# Patient Record
Sex: Male | Born: 1980
Health system: Southern US, Community
[De-identification: ages and names within clinical notes are randomized; demographics above are authoritative.]

## PROBLEM LIST (undated history)

## (undated) ENCOUNTER — Emergency Department (HOSPITAL_COMMUNITY): Payer: BC Managed Care – PPO

## (undated) DIAGNOSIS — T7840XA Allergy, unspecified, initial encounter: Secondary | ICD-10-CM

## (undated) HISTORY — DX: Allergy, unspecified, initial encounter: T78.40XA

## (undated) HISTORY — PX: VASECTOMY: SHX75

## (undated) HISTORY — PX: WISDOM TOOTH EXTRACTION: SHX21

---

## 2009-02-21 ENCOUNTER — Emergency Department (HOSPITAL_COMMUNITY): Admission: EM | Admit: 2009-02-21 | Discharge: 2009-02-21 | Payer: Self-pay | Admitting: Emergency Medicine

## 2014-02-04 ENCOUNTER — Ambulatory Visit: Payer: BC Managed Care – PPO | Admitting: Family Medicine

## 2014-02-04 VITALS — BP 152/90 | HR 92 | Temp 98.1°F | Resp 16 | Ht 76.0 in | Wt 223.2 lb

## 2014-02-04 DIAGNOSIS — N453 Epididymo-orchitis: Secondary | ICD-10-CM

## 2014-02-04 DIAGNOSIS — N451 Epididymitis: Secondary | ICD-10-CM

## 2014-02-04 MED ORDER — DOXYCYCLINE HYCLATE 100 MG PO TABS: 100.0000 mg | ORAL_TABLET | Freq: Two times a day (BID) | ORAL | Status: DC

## 2014-02-04 NOTE — Patient Instructions (Signed)
Epididymitis  Epididymitis is a swelling (inflammation) of the epididymis. The epididymis is a cord-like structure along the back part of the testicle. Epididymitis is usually, but not always, caused by infection. This is usually a sudden problem beginning with chills, fever and pain behind the scrotum and in the testicle. There may be swelling and redness of the testicle.  DIAGNOSIS   Physical examination will reveal a tender, swollen epididymis. Sometimes, cultures are obtained from the urine or from prostate secretions to help find out if there is an infection or if the cause is a different problem. Sometimes, blood work is performed to see if your white blood cell count is elevated and if a germ (bacterial) or viral infection is present. Using this knowledge, an appropriate medicine which kills germs (antibiotic) can be chosen by your caregiver. A viral infection causing epididymitis will most often go away (resolve) without treatment.  HOME CARE INSTRUCTIONS   · Hot sitz baths for 20 minutes, 4 times per day, may help relieve pain.  · Only take over-the-counter or prescription medicines for pain, discomfort or fever as directed by your caregiver.  · Take all medicines, including antibiotics, as directed. Take the antibiotics for the full prescribed length of time even if you are feeling better.  · It is very important to keep all follow-up appointments.  SEEK IMMEDIATE MEDICAL CARE IF:   · You have a fever.  · You have pain not relieved with medicines.  · You have any worsening of your problems.  · Your pain seems to come and go.  · You develop pain, redness, and swelling in the scrotum and surrounding areas.  MAKE SURE YOU:   · Understand these instructions.  · Will watch your condition.  · Will get help right away if you are not doing well or get worse.  Document Released: 08/31/2000 Document Revised: 11/26/2011 Document Reviewed: 07/21/2009  ExitCare® Patient Information ©2014 [employer].

## 2014-02-04 NOTE — Progress Notes (Addendum)
° °  Subjective:    Patient ID: Andrew Wiley ReasonMatthew Kilty, male    DOB: 04/26/1981, 33 y.o.   MRN: [id number] This chart was scribed for Andrew SidleKurt Lauenstein, MD by Valera CastleSteven Perry, ED Scribe. This patient was seen in room 01 and the patient's care was started at 6:26 PM.  Chief Complaint  Patient presents with   Testicle Pain    patient was playing softball but is unsure if that is associated x 2 weeks   Groin Pain   HPI Andrew Wiley ReasonMatthew Adamec is a 33 y.o. male Pt presents with aching, left testicle pain, onset 2 weeks ago. He denies swelling. He reports walking down steps at home a few months ago, slipped and fell on tailbone. He reports pain with sitting down that went on for about 1 month. He reports playing softball not long afterwards, went to dive, felt some testicle pain. He denies being able to pinpoint specific time of injury. He denies fever, dysuria, and any other associated symptoms.   PCP - No PCP Per Patient  There are no active problems to display for this patient.  Prior to Admission medications   Not on File   Review of Systems  Constitutional: Negative for fever.  Genitourinary: Positive for testicular pain (left). Negative for dysuria and scrotal swelling.  Skin: Negative for wound.      Objective:   Physical Exam BP 152/90   Pulse 92   Temp(Src) 98.1 F (36.7 C) (Oral)   Resp 16   Ht 6\' 4"  (1.93 m)   Wt 223 lb 3.2 oz (101.243 kg)   BMI 27.18 kg/m2   SpO2 100%  Nursing note and vitals reviewed. Constitutional: He is oriented to person, place, and time. He appears well-developed and well-nourished. No distress.  HENT:  Head: Normocephalic and atraumatic.  Eyes: EOM are normal.  Neck: Neck supple.  Cardiovascular: Normal rate.   Pulmonary/Chest: Effort normal. No respiratory distress.  Musculoskeletal: Normal range of motion.  Neurological: He is alert and oriented to person, place, and time.  Skin: Skin is warm and dry.  Psychiatric: He has a normal mood and affect. His behavior  is normal.  GU:  Left testicle slightly swollen with prominent epididymis.  No erythema or significant tenderness.  Negative hernia exam, no inguinal adenopathy     Assessment & Plan:  Epididymitis, left - Plan: US Scrotum, doxycycline (VIBRA-TABS) 100 MG tablet We have been trying to get the ultrasound department at home and [facility] to give us a time for the ultrasound. The problems come back in an hour and give is that scheduled. Signed, Andrew SidleKurt Lauenstein, MD

## 2014-02-05 ENCOUNTER — Ambulatory Visit (HOSPITAL_COMMUNITY)
Admission: RE | Admit: 2014-02-05 | Discharge: 2014-02-05 | Disposition: A | Discharge status: Home / Self Care | Payer: BC Managed Care – PPO | Source: Ambulatory Visit | Attending: Family Medicine | Admitting: Family Medicine

## 2014-02-05 ENCOUNTER — Other Ambulatory Visit: Payer: Self-pay | Admitting: Family Medicine

## 2014-02-05 ENCOUNTER — Ambulatory Visit (HOSPITAL_COMMUNITY): Payer: BC Managed Care – PPO

## 2014-02-05 DIAGNOSIS — N451 Epididymitis: Secondary | ICD-10-CM

## 2014-02-05 DIAGNOSIS — I861 Scrotal varices: Secondary | ICD-10-CM | POA: Insufficient documentation

## 2014-03-10 ENCOUNTER — Encounter: Payer: Self-pay | Admitting: Family Medicine

## 2014-03-10 DIAGNOSIS — I861 Scrotal varices: Secondary | ICD-10-CM

## 2015-07-21 LAB — CBC AND DIFFERENTIAL
HEMATOCRIT: 44 (ref 41–53)
Hemoglobin: 14.5 (ref 13.5–17.5)
PLATELETS: 364 (ref 150–399)
WBC: 5.2

## 2015-07-21 LAB — HEPATIC FUNCTION PANEL
ALT: 16 (ref 10–40)
AST: 16 (ref 14–40)
Alkaline Phosphatase: 58 (ref 25–125)
BILIRUBIN, TOTAL: 0.6

## 2015-07-21 LAB — BASIC METABOLIC PANEL
BUN: 19 (ref 4–21)
Creatinine: 0.9 (ref 0.6–1.3)
GLUCOSE: 87
Potassium: 4.7 (ref 3.4–5.3)
SODIUM: 138 (ref 137–147)

## 2015-07-21 LAB — TSH: TSH: 1.32 (ref 0.41–5.90)

## 2015-09-26 LAB — POCT ERYTHROCYTE SEDIMENTATION RATE, NON-AUTOMATED: Sed Rate: 5

## 2016-04-18 DIAGNOSIS — M545 Low back pain: Secondary | ICD-10-CM | POA: Diagnosis not present

## 2016-04-18 DIAGNOSIS — M542 Cervicalgia: Secondary | ICD-10-CM | POA: Diagnosis not present

## 2016-05-29 DIAGNOSIS — M545 Low back pain: Secondary | ICD-10-CM | POA: Diagnosis not present

## 2016-06-01 DIAGNOSIS — M542 Cervicalgia: Secondary | ICD-10-CM | POA: Diagnosis not present

## 2016-06-01 DIAGNOSIS — M545 Low back pain: Secondary | ICD-10-CM | POA: Diagnosis not present

## 2016-06-04 DIAGNOSIS — M542 Cervicalgia: Secondary | ICD-10-CM | POA: Diagnosis not present

## 2016-06-04 DIAGNOSIS — M545 Low back pain: Secondary | ICD-10-CM | POA: Diagnosis not present

## 2016-06-26 DIAGNOSIS — D225 Melanocytic nevi of trunk: Secondary | ICD-10-CM | POA: Diagnosis not present

## 2016-06-26 DIAGNOSIS — L281 Prurigo nodularis: Secondary | ICD-10-CM | POA: Diagnosis not present

## 2016-06-26 DIAGNOSIS — L814 Other melanin hyperpigmentation: Secondary | ICD-10-CM | POA: Diagnosis not present

## 2016-06-26 DIAGNOSIS — D1801 Hemangioma of skin and subcutaneous tissue: Secondary | ICD-10-CM | POA: Diagnosis not present

## 2016-08-16 ENCOUNTER — Ambulatory Visit (INDEPENDENT_AMBULATORY_CARE_PROVIDER_SITE_OTHER): Payer: BLUE CROSS/BLUE SHIELD | Admitting: Physician Assistant

## 2016-08-16 ENCOUNTER — Ambulatory Visit (INDEPENDENT_AMBULATORY_CARE_PROVIDER_SITE_OTHER): Payer: BLUE CROSS/BLUE SHIELD

## 2016-08-16 VITALS — BP 130/72 | HR 105 | Temp 99.1°F | Resp 17 | Ht 77.0 in | Wt 230.0 lb

## 2016-08-16 DIAGNOSIS — R05 Cough: Secondary | ICD-10-CM

## 2016-08-16 DIAGNOSIS — R059 Cough, unspecified: Secondary | ICD-10-CM

## 2016-08-16 LAB — POCT CBC
GRANULOCYTE PERCENT: 84.9 % — AB (ref 37–80)
HEMATOCRIT: 38.8 % — AB (ref 43.5–53.7)
HEMOGLOBIN: 13.6 g/dL — AB (ref 14.1–18.1)
Lymph, poc: 0.9 (ref 0.6–3.4)
MCH: 30.2 pg (ref 27–31.2)
MCHC: 35.1 g/dL (ref 31.8–35.4)
MCV: 86.1 fL (ref 80–97)
MID (cbc): 0.4 (ref 0–0.9)
MPV: 6.7 fL (ref 0–99.8)
POC GRANULOCYTE: 7.5 — AB (ref 2–6.9)
POC LYMPH PERCENT: 10 %L (ref 10–50)
POC MID %: 5.1 %M (ref 0–12)
Platelet Count, POC: 307 10*3/uL (ref 142–424)
RBC: 4.51 M/uL — AB (ref 4.69–6.13)
RDW, POC: 12.3 %
WBC: 8.8 10*3/uL (ref 4.6–10.2)

## 2016-08-16 LAB — POCT INFLUENZA A/B
Influenza A, POC: NEGATIVE
Influenza B, POC: NEGATIVE

## 2016-08-16 MED ORDER — HYDROCOD POLST-CPM POLST ER 10-8 MG/5ML PO SUER: 5.0000 mL | Freq: Two times a day (BID) | ORAL | 0 refills | Status: DC | PRN

## 2016-08-16 MED ORDER — AZITHROMYCIN 250 MG PO TABS: ORAL_TABLET | ORAL | 0 refills | Status: DC

## 2016-08-16 MED ORDER — BENZONATATE 100 MG PO CAPS: 100.0000 mg | ORAL_CAPSULE | Freq: Three times a day (TID) | ORAL | 0 refills | Status: DC | PRN

## 2016-08-16 NOTE — Progress Notes (Signed)
MRN: [id number] DOB: 01/17/1981  Subjective:   Andrew Wiley is a 35 y.o. male presenting for chief complaint of Fatigue (onset 1 week) and Cough (onset 4 days/ productive green) . One week ago, [place] [facility], got home and had no energy out of no where. Had to just lie around in the bed for a few days. Had associated chills and body aches. After about 3 days he started feeling better but then developed cough. Has associated sinus headache, sinus congestion, rhinorrhea and sore throat and night sweats. Has tried tylenol cold and flu and mucinex with mild temporarily relief. Denies fever, wheezing, shortness of breath and chest pain, nausea, vomiting and abdominal pain. Has  had sick contact with baby at home who has croup. Has history of seasonal allergies, no history of asthma. Patient has not had flu shot this season. Denies smoking. Denies any other aggravating or relieving factors, no other questions or concerns.  Molli HazardMatthew has a current medication list which includes the following prescription(s): azithromycin. Also has No Known Allergies.  Molli HazardMatthew  has a past medical history of Allergy. Also  has a past surgical history that includes Vasectomy.   Objective:   Vitals: BP 130/72 (BP Location: Right Arm, Patient Position: Sitting, Cuff Size: Normal)   Pulse (!) 105   Temp 99.1 F (37.3 C) (Oral)   Resp 17   Ht 6\' 5"  (1.956 m)   Wt 230 lb (104.3 kg)   SpO2 97%   BMI 27.27 kg/m   Physical Exam  Constitutional: He is oriented to person, place, and time. He appears well-developed and well-nourished. He appears distressed (mild).  HENT:  Head: Normocephalic and atraumatic.  Right Ear: Tympanic membrane, external ear and ear canal normal.  Left Ear: Tympanic membrane, external ear and ear canal normal.  Nose: Mucosal edema and rhinorrhea present. Right sinus exhibits no maxillary sinus tenderness and no frontal sinus tenderness. Left sinus exhibits no  maxillary sinus tenderness and no frontal sinus tenderness.  Mouth/Throat: Uvula is midline and mucous membranes are normal. Posterior oropharyngeal erythema present.  Eyes: Conjunctivae are normal.  Neck: Normal range of motion.  Cardiovascular: Regular rhythm, normal heart sounds and normal pulses.  Tachycardia present.   Pulmonary/Chest: Effort normal and breath sounds normal.  Lymphadenopathy:       Head (right side): No submental, no submandibular, no tonsillar, no preauricular, no posterior auricular and no occipital adenopathy present.       Head (left side): No submental, no submandibular, no tonsillar, no preauricular, no posterior auricular and no occipital adenopathy present.    He has no cervical adenopathy.       Right: No supraclavicular adenopathy present.       Left: No supraclavicular adenopathy present.  Neurological: He is alert and oriented to person, place, and time.  Skin: Skin is warm and dry.  Psychiatric: He has a normal mood and affect.  Vitals reviewed.   Results for orders placed or performed in visit on 08/16/16 (from the past 24 hour(s))  POCT CBC     Status: Abnormal   Collection Time: 08/16/16  8:59 AM  Result Value Ref Range   WBC 8.8 4.6 - 10.2 K/uL   Lymph, poc 0.9 0.6 - 3.4   POC LYMPH PERCENT 10.0 10 - 50 %L   MID (cbc) 0.4 0 - 0.9   POC MID % 5.1 0 - 12 %M   POC Granulocyte 7.5 (A) 2 - 6.9  Granulocyte percent 84.9 (A) 37 - 80 %G   RBC 4.51 (A) 4.69 - 6.13 M/uL   Hemoglobin 13.6 (A) 14.1 - 18.1 g/dL   HCT, POC 16.138.8 (A) 09.643.5 - 53.7 %   MCV 86.1 80 - 97 fL   MCH, POC 30.2 27 - 31.2 pg   MCHC 35.1 31.8 - 35.4 g/dL   RDW, POC 04.512.3 %   Platelet Count, POC 307 142 - 424 K/uL   MPV 6.7 0 - 99.8 fL  POCT Influenza A/B     Status: None   Collection Time: 08/16/16  9:10 AM  Result Value Ref Range   Influenza A, POC Negative Negative   Influenza B, POC Negative Negative    Dg Chest 2 View  Result Date: 08/16/2016 CLINICAL DATA:  Cough and  tachycardia EXAM: CHEST  2 VIEW COMPARISON:  None. FINDINGS: Lungs are clear. Heart size and pulmonary vascularity are normal. No adenopathy. No bone lesions. IMPRESSION: No edema or consolidation. Electronically Signed   By: Bretta BangWilliam  Woodruff III M.D.   On: 08/16/2016 09:50   Assessment and Plan :  1. Cough -Likely pt had flu and is developing secondary bacterial infection. Will cover for atypical organisms. Pt instructed to follow up if no improvement with treatment. Return sooner if symptoms worsen.  - POCT Influenza A/B - POCT CBC - DG Chest 2 View; Future - azithromycin (ZITHROMAX) 250 MG tablet; Take 2 tabs PO x 1 dose, then 1 tab PO QD x 4 days  Dispense: 6 tablet; Refill: 0 - chlorpheniramine-HYDROcodone (TUSSIONEX PENNKINETIC ER) 10-8 MG/5ML SUER; Take 5 mLs by mouth every 12 (twelve) hours as needed for cough.  Dispense: 100 mL; Refill: 0 - benzonatate (TESSALON) 100 MG capsule; Take 1-2 capsules (100-200 mg total) by mouth 3 (three) times daily as needed for cough.  Dispense: 40 capsule; Refill: 0  Benjiman CoreBrittany Olamide Lahaie, PA-C  [facility] 08/16/2016 9:52 AM

## 2016-08-16 NOTE — Patient Instructions (Addendum)
   Community-Acquired Pneumonia, Adult Introduction Pneumonia is an infection of the lungs. One type of pneumonia can happen while a person is in a hospital. A different type can happen when a person is not in a hospital (community-acquired pneumonia). It is easy for this kind to spread from person to person. It can spread to you if you breathe near an infected person who coughs or sneezes. Some symptoms include:  A dry cough.  A wet (productive) cough.  Fever.  Sweating.  Chest pain. Follow these instructions at home:  Take over-the-counter and prescription medicines only as told by your doctor.  Only take cough medicine if you are losing sleep.  If you were prescribed an antibiotic medicine, take it as told by your doctor. Do not stop taking the antibiotic even if you start to feel better.  Sleep with your head and neck raised (elevated). You can do this by putting a few pillows under your head, or you can sleep in a recliner.  Do not use tobacco products. These include cigarettes, chewing tobacco, and e-cigarettes. If you need help quitting, ask your doctor.  Drink enough water to keep your pee (urine) clear or pale yellow. A shot (vaccine) can help prevent pneumonia. Shots are often suggested for:  People older than 37 years of age.  People older than 37 years of age:  Who are having cancer treatment.  Who have long-term (chronic) lung disease.  Who have problems with their body's defense system (immune system). You may also prevent pneumonia if you take these actions:  Get the flu (influenza) shot every year.  Go to the dentist as often as told.  Wash your hands often. If soap and water are not available, use hand sanitizer. Contact a doctor if:  You have a fever.  You lose sleep because your cough medicine does not help. Get help right away if:  You are short of breath and it gets worse.  You have more chest pain.  Your sickness gets worse. This is very  serious if:  You are an older adult.  Your body's defense system is weak.  You cough up blood. This information is not intended to replace advice given to you by your health care provider. Make sure you discuss any questions you have with your health care provider. Document Released: 02/20/2008 Document Revised: 02/09/2016 Document Reviewed: 12/29/2014  2017 Elsevier    IF you received an x-ray today, you will receive an invoice from [facility]. Please contact [facility] at [phone] with questions or concerns regarding your invoice.   IF you received labwork today, you will receive an invoice from [facility]. Please contact [facility] at [phone] with questions or concerns regarding your invoice.   Our billing staff will not be able to assist you with questions regarding bills from these companies.  You will be contacted with the lab results as soon as they are available. The fastest way to get your results is to activate your My Chart account. Instructions are located on the last page of this paperwork. If you have not heard from us regarding the results in 2 weeks, please contact this office.

## 2016-08-20 ENCOUNTER — Telehealth: Payer: Self-pay | Admitting: Family Medicine

## 2016-08-20 NOTE — Telephone Encounter (Signed)
Routed to [employer]

## 2016-08-20 NOTE — Telephone Encounter (Signed)
Please call pt and let him know that the z pack will be in his system for up to two weeks so he is still getting an antibiotic. Cough can last for up to 4 weeks when you have pneumonia. He should be feeling better though not worse, if he is feeling worse, have him come back in and be reevaluated.

## 2016-08-20 NOTE — Telephone Encounter (Signed)
Pt calling to let GrenadaBrittany know that the cough is still there he just finish Z pak and tesslon pills do work he's just concerned about the cough being so productive

## 2016-08-21 ENCOUNTER — Telehealth: Payer: Self-pay | Admitting: Emergency Medicine

## 2016-08-21 NOTE — Telephone Encounter (Signed)
Pt advised to call clinic if no improvement States he is feeling much better

## 2016-09-27 DIAGNOSIS — J309 Allergic rhinitis, unspecified: Secondary | ICD-10-CM | POA: Diagnosis not present

## 2016-09-27 DIAGNOSIS — Z0001 Encounter for general adult medical examination with abnormal findings: Secondary | ICD-10-CM | POA: Diagnosis not present

## 2016-09-27 DIAGNOSIS — K219 Gastro-esophageal reflux disease without esophagitis: Secondary | ICD-10-CM | POA: Diagnosis not present

## 2016-09-27 DIAGNOSIS — M545 Low back pain: Secondary | ICD-10-CM | POA: Diagnosis not present

## 2017-03-08 ENCOUNTER — Ambulatory Visit (INDEPENDENT_AMBULATORY_CARE_PROVIDER_SITE_OTHER): Payer: BLUE CROSS/BLUE SHIELD | Admitting: Family Medicine

## 2017-03-08 ENCOUNTER — Encounter: Payer: Self-pay | Admitting: Family Medicine

## 2017-03-08 VITALS — BP 138/88 | HR 85 | Temp 98.4°F | Wt 239.6 lb

## 2017-03-08 DIAGNOSIS — Z7689 Persons encountering health services in other specified circumstances: Secondary | ICD-10-CM

## 2017-03-08 DIAGNOSIS — R0982 Postnasal drip: Secondary | ICD-10-CM

## 2017-03-08 DIAGNOSIS — J029 Acute pharyngitis, unspecified: Secondary | ICD-10-CM | POA: Diagnosis not present

## 2017-03-08 DIAGNOSIS — R0789 Other chest pain: Secondary | ICD-10-CM

## 2017-03-08 DIAGNOSIS — M25522 Pain in left elbow: Secondary | ICD-10-CM

## 2017-03-08 DIAGNOSIS — K219 Gastro-esophageal reflux disease without esophagitis: Secondary | ICD-10-CM

## 2017-03-08 NOTE — Patient Instructions (Addendum)
Can take Pepcid or Zantac twice a day, daily or as needed.   Fore sore throat- take your zyrtec daily for 5-7 days, can also take ibuprofen or tylenol  Call for an appointment with Dr. Berline Chough for elbow   Food Choices for Gastroesophageal Reflux Disease, Adult When you have gastroesophageal reflux disease (GERD), the foods you eat and your eating habits are very important. Choosing the right foods can help ease the discomfort of GERD. Consider working with a diet and nutrition specialist (dietitian) to help you make healthy food choices. What general guidelines should I follow? Eating plan  Choose healthy foods low in fat, such as fruits, vegetables, whole grains, low-fat dairy products, and lean meat, fish, and poultry.  Eat frequent, small meals instead of three large meals each day. Eat your meals slowly, in a relaxed setting. Avoid bending over or lying down until 2-3 hours after eating.  Limit high-fat foods such as fatty meats or fried foods.  Limit your intake of oils, butter, and shortening to less than 8 teaspoons each day.  Avoid the following: ? Foods that cause symptoms. These may be different for different people. Keep a food diary to keep track of foods that cause symptoms. ? Alcohol. ? Drinking large amounts of liquid with meals. ? Eating meals during the 2-3 hours before bed.  Cook foods using methods other than frying. This may include baking, grilling, or broiling. Lifestyle   Maintain a healthy weight. Ask your health care provider what weight is healthy for you. If you need to lose weight, work with your health care provider to do so safely.  Exercise for at least 30 minutes on 5 or more days each week, or as told by your health care provider.  Avoid wearing clothes that fit tightly around your waist and chest.  Do not use any products that contain nicotine or tobacco, such as cigarettes and e-cigarettes. If you need help quitting, ask your health care  provider.  Sleep with the head of your bed raised. Use a wedge under the mattress or blocks under the bed frame to raise the head of the bed. What foods are not recommended? The items listed may not be a complete list. Talk with your dietitian about what dietary choices are best for you. Grains Pastries or quick breads with added fat. [place] toast. Vegetables Deep fried vegetables. [place] fries. Any vegetables prepared with added fat. Any vegetables that cause symptoms. For some people this may include tomatoes and tomato products, chili peppers, onions and garlic, and horseradish. Fruits Any fruits prepared with added fat. Any fruits that cause symptoms. For some people this may include citrus fruits, such as oranges, grapefruit, pineapple, and lemons. Meats and other protein foods High-fat meats, such as fatty beef or pork, hot dogs, ribs, ham, sausage, salami and bacon. Fried meat or protein, including fried fish and fried chicken. Nuts and nut butters. Dairy Whole milk and chocolate milk. Sour cream. Cream. Ice cream. Cream cheese. Milk shakes. Beverages Coffee and tea, with or without caffeine. Carbonated beverages. Sodas. Energy drinks. Fruit juice made with acidic fruits (such as orange or grapefruit). Tomato juice. Alcoholic drinks. Fats and oils Butter. Margarine. Shortening. Ghee. Sweets and desserts Chocolate and cocoa. Donuts. Seasoning and other foods Pepper. Peppermint and spearmint. Any condiments, herbs, or seasonings that cause symptoms. For some people, this may include curry, hot sauce, or vinegar-based salad dressings. Summary  When you have gastroesophageal reflux disease (GERD), food and lifestyle choices are very  important to help ease the discomfort of GERD.  Eat frequent, small meals instead of three large meals each day. Eat your meals slowly, in a relaxed setting. Avoid bending over or lying down until 2-3 hours after eating.  Limit high-fat foods such as fatty  meat or fried foods. This information is not intended to replace advice given to you by your health care provider. Make sure you discuss any questions you have with your health care provider. Document Released: 09/03/2005 Document Revised: 09/04/2016 Document Reviewed: 09/04/2016 Elsevier Interactive Patient Education  2017 [employer]

## 2017-03-08 NOTE — Progress Notes (Signed)
   Subjective:    Patient ID: Andrew Wiley R Andrew Wiley, male    DOB: 05/18/1981, 36 y.o.   MRN: [id number]  HPI This is a 36 yo male who presents today to establish care. He is married with 38 yo,38yo, 1814 month old. Works for [employer], [employer]. Plays golf.   Has had sore throat, worse in morning. Right side worse than left. Some pain into neck. Pain on and off for a few weeks. Some post nasal drainage, little nasal drainage. Some headache at back. No sinus pressure, no cough. Has seasonal allergies and takes Zyrtec with good relief. Has not taken recently. Had some stomach pain last week, has occasional acid reflux. Takes omeprazole sporatically.   Has left sided elbow pain, back pain. Played sports through college and has had several injuries. Has pain over sternum off and on, worse with weight lifting. Has had work up for chest pain in the past with negative ekg.   Past Medical History:  Diagnosis Date  . Allergy    Past Surgical History:  Procedure Laterality Date  . VASECTOMY     Family History  Problem Relation Age of Onset  . Diabetes Maternal Grandmother   . Diabetes Maternal Grandfather   . Hyperlipidemia Father   . Hypertension Father       Review of Systems Per HPI    Objective:   Physical Exam  Constitutional: He is oriented to person, place, and time. He appears well-developed and well-nourished. No distress.  HENT:  Head: Normocephalic and atraumatic.  Right Ear: Tympanic membrane, external ear and ear canal normal.  Left Ear: Tympanic membrane, external ear and ear canal normal.  Nose: Nose normal.  Mouth/Throat: Posterior oropharyngeal erythema (mild, post nasal drainage present) present. No oropharyngeal exudate or posterior oropharyngeal edema.  Eyes: Conjunctivae are normal.  Neck: Normal range of motion. Neck supple.  Cardiovascular: Normal rate, regular rhythm and normal heart sounds.   Pulmonary/Chest: Effort normal and breath sounds normal. He exhibits no  tenderness.  Neurological: He is alert and oriented to person, place, and time.  Skin: Skin is warm and dry. He is not diaphoretic.  Psychiatric: He has a normal mood and affect. His behavior is normal. Judgment and thought content normal.  Vitals reviewed.     BP 138/88 (BP Location: Right Arm, Patient Position: Sitting, Cuff Size: Normal)   Pulse 85   Temp 98.4 F (36.9 C) (Oral)   Wt 239 lb 9.6 oz (108.7 kg)   SpO2 98%   BMI 28.41 kg/m      Assessment & Plan:  1. Encounter to establish care - follow up for CPE, will request records and determine overdue health maintenance  2. Sore throat - likely related to #3, advised to resume Zyrtec, can also take OTC analgesics, perform salt water gargles - RTC if no improvement with above measures  3. Post-nasal drainage - see #2  4. Chest wall pain - not currently bothering him, discussed tracking triggers, can apply heat and take OTC analgesics  5. Left elbow pain - he wishes to make an appointment with Dr. Berline Choughigby  6. Gastroesophageal reflux disease, esophagitis presence not specified - discussed use of omeprazole, can try H2 blocker if looking to use something PRN that will work faster than PPI  Andrew Reeeborah Choua Ikner, FNP-BC  [facility] [place]  03/08/2017 8:31 PM

## 2017-03-13 ENCOUNTER — Ambulatory Visit (INDEPENDENT_AMBULATORY_CARE_PROVIDER_SITE_OTHER): Payer: BLUE CROSS/BLUE SHIELD

## 2017-03-13 ENCOUNTER — Encounter: Payer: Self-pay | Admitting: Sports Medicine

## 2017-03-13 ENCOUNTER — Ambulatory Visit (INDEPENDENT_AMBULATORY_CARE_PROVIDER_SITE_OTHER): Payer: BLUE CROSS/BLUE SHIELD | Admitting: Sports Medicine

## 2017-03-13 VITALS — BP 140/82 | HR 73 | Ht 77.0 in | Wt 239.8 lb

## 2017-03-13 DIAGNOSIS — M25522 Pain in left elbow: Secondary | ICD-10-CM

## 2017-03-13 DIAGNOSIS — M7702 Medial epicondylitis, left elbow: Secondary | ICD-10-CM | POA: Diagnosis not present

## 2017-03-13 MED ORDER — NITROGLYCERIN 0.2 MG/HR TD PT24: MEDICATED_PATCH | TRANSDERMAL | 1 refills | Status: DC

## 2017-03-13 NOTE — Progress Notes (Signed)
OFFICE VISIT NOTE Andrew Wiley. [facility] In[facility] at [facility] [phone]  Andrew Wiley - 36 y.o. male MRN [id number]  Date of birth: 1980/12/19  Visit Date: 03/13/2017  PCP: Andrew Belfast, FNP   Referred by: Andrew Belfast, FNP  Andrew Wiley, CMA acting as scribe for Dr. Berline Chough.  SUBJECTIVE:   Chief Complaint  Patient presents with  . left elbow pain   HPI: As below and per problem based documentation when appropriate.  Pt presents today with complaint of pain in the left elbow.  Pain started a couple of years ago. He has been seen by ortho Andrew Wiley) in the past and was told he had a minor strain.  He first noticed the pain when trying a new exercise at the gym. He played sports throughout college, left handed pitcher. Pain is mostly on the ulnar aspect of the elbow.   The pain is described as pulling sensation and is rated as 5/10 when flaring up.  Worsened with lifting. He feels like something in his elbow is going to pop when he is holding his daughter. He also has pain when elbows are on a hard surface. Nothing seems to help alleviate the pain but it is not a constant pain.  Therapies tried include : He has tried using a compression sleeve with no relief. He was given home exercises in the past and got some improvement. He was told there are certain things he shouldn't do but when the pain isn't present to sometimes is noncompliant.   Other associated symptoms include: He experiences some numbness in the left arm when his elbow/arm is positioned a certain way.   Pt denies fever, chills, night sweats.     Review of Systems  Constitutional: Negative for chills and fever.  Respiratory: Negative for shortness of breath and wheezing.   Cardiovascular: Positive for chest pain. Negative for palpitations.  Musculoskeletal: Positive for joint pain. Negative for falls.  Neurological: Positive for dizziness and  tingling. Negative for headaches.  Endo/Heme/Allergies: Does not bruise/bleed easily.    Otherwise per HPI.  HISTORY & PERTINENT PRIOR DATA:  No specialty comments available. He reports that he has never smoked. He has quit using smokeless tobacco. No results for input(s): HGBA1C, LABURIC in the last 8760 hours. Medications & Allergies reviewed per EMR Patient Active Problem List   Diagnosis Date Noted  . Golfer's elbow, left 04/06/2017  . Abscess of left knee 04/05/2017  . Left elbow pain 03/13/2017  . Bilateral varicoceles 03/10/2014   Past Medical History:  Diagnosis Date  . Allergy    Family History  Problem Relation Age of Onset  . Diabetes Maternal Grandmother   . Diabetes Maternal Grandfather   . Hyperlipidemia Father   . Hypertension Father    Past Surgical History:  Procedure Laterality Date  . VASECTOMY    . WISDOM TOOTH EXTRACTION     Social History   Occupational History  . Not on file.   Social History Main Topics  . Smoking status: Never Smoker  . Smokeless tobacco: Former Neurosurgeon  . Alcohol use 2.5 oz/week    5 Standard drinks or equivalent per week  . Drug use: No  . Sexual activity: Not on file    OBJECTIVE:  VS:  HT:6\' 5"  (195.6 cm)   WT:239 lb 12.8 oz (108.8 kg)  BMI:28.5    BP:140/82  HR:73bpm  TEMP: ( )  RESP:98 % EXAM: Findings:  WDWN, NAD, Non-toxic appearing Alert & appropriately interactive Not depressed or anxious appearing No increased work of breathing. Pupils are equal. EOM intact without nystagmus No clubbing or cyanosis of the extremities appreciated No significant rashes/lesions/ulcerations overlying the examined area. Radial pulses 2+/4.  No significant generalized UE edema. Sensation intact to light touch in upper extremities.  Left elbow: Overall well aligned but he does have a prominence of the medial condyle on the left.  He has pain with resisted wrist flexion and with grip strength which is minimal.  Focal TTP over  the origin of the common flexor tendon.  He is ligamentously stable and no pain with milking maneuver.       Dg Elbow 2 Views Left  Result Date: 03/13/2017 CLINICAL DATA:  Left elbow pain, no known injury, initial encounter EXAM: LEFT ELBOW - 2 VIEW COMPARISON:  None. FINDINGS: There is no evidence of fracture, dislocation, or joint effusion. There is no evidence of arthropathy or other focal bone abnormality. Soft tissues are unremarkable. IMPRESSION: No acute abnormality noted. Electronically Signed   By: Alcide CleverMark  Wiley M.D.   On: 03/13/2017 16:27   ASSESSMENT & PLAN:  Andrew Wiley was seen today for left elbow pain. Diagnoses and all orders for this visit:  Left elbow pain -     DG Elbow 2 Views Left; Future  Golfer's elbow, left -     nitroGLYCERIN (NITRODUR - DOSED IN MG/24 HR) 0.2 mg/hr patch; Place 1/4 to 1/2 of a patch over affected region. Remove and replace once daily.  Slightly alter skin placement daily  ================================================================= Golfer's elbow, left Symptoms are consistent with medial epicondylosis.  Nitroglycerin protocol, eccentric exercises and common flexor tendon offloading bracing/compression reviewed.  If any lack of improvement will obtain MSK ultrasound follow-up  ================================================================= Follow-up: Return in about 6 weeks (around 04/24/2017).   CMA/ATC served as Neurosurgeonscribe during this visit. History, Physical, and Plan performed by medical provider. Documentation and orders reviewed and attested to.      Andrew BiddingMichael Jary Louvier, DO    Andrew Wiley [facility] Medicine Physician

## 2017-03-18 ENCOUNTER — Telehealth: Payer: Self-pay | Admitting: Family Medicine

## 2017-03-18 NOTE — Telephone Encounter (Signed)
ROI fax to [employer] [facility] [employer] °

## 2017-03-26 ENCOUNTER — Encounter: Payer: Self-pay | Admitting: Sports Medicine

## 2017-03-26 ENCOUNTER — Ambulatory Visit (INDEPENDENT_AMBULATORY_CARE_PROVIDER_SITE_OTHER): Payer: BLUE CROSS/BLUE SHIELD | Admitting: Sports Medicine

## 2017-03-26 VITALS — BP 128/92 | HR 66 | Ht 77.0 in | Wt 235.4 lb

## 2017-03-26 DIAGNOSIS — G8929 Other chronic pain: Secondary | ICD-10-CM

## 2017-03-26 DIAGNOSIS — M545 Low back pain: Secondary | ICD-10-CM

## 2017-03-26 NOTE — Patient Instructions (Signed)
Also check out the YouTube Video from Dr. Eric Goodman.  I would like to see you try performing this 5-6 days per week.    A good intro video is: "Independence from Pain 7-minute Video" - https://www.youtube.com/watch?[email or web address]   His more advanced video is: "Powerful Posture and Pain Relief: 12 minutes of Foundation Training" - https://youtu.be/4BOTvaRaDjI   Do not try to attempt this entire video when first beginning.    Try breaking of each exercise that he goes into shorter segments.  Otherwise if they perform an exercise for 45 seconds, start with 15 seconds and rest and then resume with a begin the new activity.  Work your way up to doing this 12 minute video and I expect to see significant improvements in your pain.  

## 2017-03-26 NOTE — Progress Notes (Signed)
OFFICE VISIT NOTE Andrew Wiley. Andrew Wiley Sports Medicine [facility] at [facility] [phone]  Andrew Wiley - 36 y.o. male MRN [id number]  Date of birth: 28-Jun-1981  Visit Date: 03/26/2017  PCP: Andrew Belfast, FNP   Referred by: Andrew Belfast, FNP  Andrew Wiley, CMA acting as scribe for Dr. Berline Chough.  SUBJECTIVE:   Chief Complaint  Patient presents with  . pain in back   HPI: As below and per problem based documentation when appropriate.  Pt presents today with complaint of back pain. Most of his pain is in the lower back and neck.  This is a chronic issue x several years. He has seen ortho and PT in the past and gotten no long term relief. He had xray's of c-spine and l-spine done at [facility] about 1 year ago. Pt has hx of injury to the lower back/sacrum. His lower back pain started when he son was born from reaching to pick him up and playing with him. He occasional left sided chest wall pain. He has had cardiac workup in the past. He feels the chest wall pain when he turns his head to the right.   The pain is described as aching and stiffness. Lower back pain is rated as 6/10, more like a pinching sensation. The pain does occasionally radiate into the left leg. Neck pain is rated as about 3/10 and is more of a dull aching pain.   Worsened with sitting for extended periods of time.  Improves with rest Therapies tried include : Naproxen with some relief, rarely as needed.   Other associated symptoms include: chest wall pain, shoulder pain, radiating pain into the left leg. He is unable to do any type of spinning.   Pt denies fever, night sweats. He does have chills on occasion. Sometimes when his neck is hurting he gets chills and fill nauseated.    Review of Systems  Constitutional: Negative for chills and fever.  Respiratory: Negative for shortness of breath and wheezing.   Cardiovascular: Negative for chest pain, palpitations and  leg swelling.  Musculoskeletal: Positive for back pain and neck pain. Negative for falls.  Neurological: Positive for dizziness, tingling (left arm) and headaches.  Endo/Heme/Allergies: Does not bruise/bleed easily.    Otherwise per HPI.  HISTORY & PERTINENT PRIOR DATA:  No specialty comments available. He reports that he has never smoked. He has quit using smokeless tobacco. No results for input(s): HGBA1C, LABURIC in the last 8760 hours. Medications & Allergies reviewed per EMR Patient Active Problem List   Diagnosis Date Noted  . Low back pain 04/27/2017  . Golfer's elbow, left 04/06/2017  . Abscess of left knee 04/05/2017  . Left elbow pain 03/13/2017  . Bilateral varicoceles 03/10/2014   Past Medical History:  Diagnosis Date  . Allergy    Family History  Problem Relation Age of Onset  . Diabetes Maternal Grandmother   . Diabetes Maternal Grandfather   . Hyperlipidemia Father   . Hypertension Father    Past Surgical History:  Procedure Laterality Date  . VASECTOMY    . WISDOM TOOTH EXTRACTION     Social History   Occupational History  . Not on file.   Social History Main Topics  . Smoking status: Never Smoker  . Smokeless tobacco: Former Neurosurgeon  . Alcohol use 2.5 oz/week    5 Standard drinks or equivalent per week  . Drug use: No  . Sexual activity: Not on file  OBJECTIVE:  VS:  HT:6\' 5"  (195.6 cm)   WT:235 lb 6.4 oz (106.8 kg)  BMI:28    BP:(!) 128/92  HR:66bpm  TEMP: ( )  RESP:97 % EXAM: Findings:  WDWN, NAD, Non-toxic appearing Alert & appropriately interactive Not depressed or anxious appearing No increased work of breathing. Pupils are equal. EOM intact without nystagmus No clubbing or cyanosis of the extremities appreciated No significant rashes/lesions/ulcerations overlying the examined area. DP & PT pulses 2+/4.  No significant pretibial edema.  No clubbing or cyanosis Sensation intact to light touch in lower extremities.  Back & Lower  Extremities: Bilateral negative straight leg raise. No significant midline tenderness.   No focal TTP.  Generalized nonfocal soreness in bilateral SI joint regions. Good internal and external rotation of the hips. Patient is able to heel and toe walk without significant difficulty.  Manual muscle testing is 5+/5 in BLE myotomes without focality Lower extremity DTRs 2+/4 diffusely and symmetric      No results found. ASSESSMENT & PLAN:     ICD-10-CM   1. Chronic bilateral low back pain without sciatica M54.5    G89.29   ================================================================= Low back pain Functional low back pain.  Therapeutic exercises reviewed with patient importance of core conditioning exercises reviewed.  Foundations training videos provided.  Follow-up for repeat evaluation and consideration of further diagnostic evaluation if worsening or more focal pain. =================================================================  Follow-up: No Follow-up on file.   CMA/ATC served as Neurosurgeonscribe during this visit. History, Physical, and Plan performed by medical provider. Documentation and orders reviewed and attested to.      Gaspar BiddingMichael Rigby, DO    [facility] Phys[facility]

## 2017-04-05 DIAGNOSIS — L02416 Cutaneous abscess of left lower limb: Secondary | ICD-10-CM | POA: Insufficient documentation

## 2017-04-06 DIAGNOSIS — G5622 Lesion of ulnar nerve, left upper limb: Secondary | ICD-10-CM | POA: Insufficient documentation

## 2017-04-06 NOTE — Assessment & Plan Note (Signed)
Symptoms are consistent with medial epicondylosis.  Nitroglycerin protocol, eccentric exercises and common flexor tendon offloading bracing/compression reviewed.  If any lack of improvement will obtain MSK ultrasound follow-up

## 2017-04-18 DIAGNOSIS — K219 Gastro-esophageal reflux disease without esophagitis: Secondary | ICD-10-CM | POA: Diagnosis not present

## 2017-04-18 DIAGNOSIS — J310 Chronic rhinitis: Secondary | ICD-10-CM | POA: Diagnosis not present

## 2017-04-24 ENCOUNTER — Ambulatory Visit: Payer: BLUE CROSS/BLUE SHIELD | Admitting: Sports Medicine

## 2017-04-26 ENCOUNTER — Ambulatory Visit: Payer: BLUE CROSS/BLUE SHIELD | Admitting: Sports Medicine

## 2017-04-27 DIAGNOSIS — M545 Low back pain, unspecified: Secondary | ICD-10-CM | POA: Insufficient documentation

## 2017-04-27 NOTE — Assessment & Plan Note (Signed)
Functional low back pain.  Therapeutic exercises reviewed with patient importance of core conditioning exercises reviewed.  Foundations training videos provided.  Follow-up for repeat evaluation and consideration of further diagnostic evaluation if worsening or more focal pain.

## 2017-04-29 ENCOUNTER — Ambulatory Visit: Payer: BLUE CROSS/BLUE SHIELD | Admitting: Sports Medicine

## 2017-04-29 DIAGNOSIS — Z0289 Encounter for other administrative examinations: Secondary | ICD-10-CM

## 2017-04-30 ENCOUNTER — Encounter: Payer: Self-pay | Admitting: Sports Medicine

## 2017-04-30 ENCOUNTER — Ambulatory Visit: Payer: Self-pay

## 2017-04-30 ENCOUNTER — Ambulatory Visit (INDEPENDENT_AMBULATORY_CARE_PROVIDER_SITE_OTHER): Payer: BLUE CROSS/BLUE SHIELD | Admitting: Sports Medicine

## 2017-04-30 VITALS — BP 120/80 | HR 73 | Ht 77.0 in | Wt 238.4 lb

## 2017-04-30 DIAGNOSIS — M545 Low back pain, unspecified: Secondary | ICD-10-CM

## 2017-04-30 DIAGNOSIS — M25522 Pain in left elbow: Secondary | ICD-10-CM

## 2017-04-30 DIAGNOSIS — G5622 Lesion of ulnar nerve, left upper limb: Secondary | ICD-10-CM

## 2017-04-30 DIAGNOSIS — G8929 Other chronic pain: Secondary | ICD-10-CM | POA: Diagnosis not present

## 2017-04-30 NOTE — Progress Notes (Signed)
OFFICE VISIT NOTE Veverly FellsMichael D. Delorise Shinerigby, DO  [facility] [phone]  Sharlotte AlamoMatthew R Tromp - 36 y.o. male MRN [id number]  Date of birth: 05/19/1981  Visit Date: 04/30/2017  PCP: Emi BelfastGessner, Deborah B, FNP   Referred by: Emi BelfastGessner, Deborah B, FNP  Orlie DakinBrandy Shelton, CMA acting as scribe for Dr. Berline Choughigby.  SUBJECTIVE:   Chief Complaint  Patient presents with  . Follow-up    chronic bilateral low back pain   HPI: As below and per problem based documentation when appropriate.  Mr. Andrew Wiley is an established patient presenting today in follow-up of chronic bilateral low back pain with sciatica. He was last seen 03/26/2017 and provided with core conditioning and foundation exercises.   Pt reports that he has been doing the exercises provided sometimes. He says that he has felt pretty good since his last visit. Back pain is currently rated about 3/10. The pain comes and goes depending on his positioning. Pain is worse with bending, leaning down or from one side to the other. The left side of the back is worse than the right. He does have occasional radiation of pain into both legs. He has been having pain in his right foot for the past week and a half. The pain is occasionally sharp but just feels like general pain, at times up to 6/10. He says that it feels like he is stepping on a thin board right at the arch of his foot. This pain only occurs when walking on a incline.   He is also following up on his left elbow. He had tried following Nitro Protocol in the past but he wasn't really noticing a difference in the pain and he was having headaches. Pt reports that his elbow is generally about the same. Pain is worse when he put any type of strain on his left arm, like carrying is daughter. He says that he feels like the elbow is swollen. He denies recent pinching, popping, cracking. The pain will occasionally radiate into the forearm. The pain seems to come and go  pretty quickly. He has noticed a lot of tightness in his forearm when he is stretching. Has done some stretching and weight lifting over the past 2 days.     Review of Systems  Constitutional: Negative for chills and fever.  HENT: Positive for congestion.   Eyes: Negative.   Respiratory: Negative for shortness of breath and wheezing.   Cardiovascular: Positive for chest pain (not cardiac related). Negative for palpitations.  Gastrointestinal: Negative.   Genitourinary: Negative.   Musculoskeletal: Positive for myalgias. Negative for falls.  Skin: Negative.   Neurological: Positive for dizziness, tingling and headaches.  Endo/Heme/Allergies: Does not bruise/bleed easily.  Psychiatric/Behavioral: Negative.     Otherwise per HPI.  HISTORY & PERTINENT PRIOR DATA:  No specialty comments available. He reports that he has never smoked. He has quit using smokeless tobacco. No results for input(s): HGBA1C, LABURIC in the last 8760 hours. Medications & Allergies reviewed per EMR Patient Active Problem List   Diagnosis Date Noted  . Low back pain 04/27/2017  . Ulnar neuritis, left 04/06/2017  . Abscess of left knee 04/05/2017  . Left elbow pain 03/13/2017  . Bilateral varicoceles 03/10/2014   Past Medical History:  Diagnosis Date  . Allergy    Family History  Problem Relation Age of Onset  . Diabetes Maternal Grandmother   . Diabetes Maternal Grandfather   . Hyperlipidemia Father   . Hypertension Father  Past Surgical History:  Procedure Laterality Date  . VASECTOMY    . WISDOM TOOTH EXTRACTION     Social History   Occupational History  . Not on file.   Social History Main Topics  . Smoking status: Never Smoker  . Smokeless tobacco: Former Neurosurgeon  . Alcohol use 2.5 oz/week    5 Standard drinks or equivalent per week  . Drug use: No  . Sexual activity: Not on file    OBJECTIVE:  VS:  HT:6\' 5"  (195.6 cm)   WT:238 lb 6.4 oz (108.1 kg)  BMI:28.3    BP:120/80  HR:73bpm   TEMP: ( )  RESP:98 % EXAM: Findings:  WDWN, NAD, Non-toxic appearing Alert & appropriately interactive Not depressed or anxious appearing No increased work of breathing. Pupils are equal. EOM intact without nystagmus No clubbing or cyanosis of the extremities appreciated No significant rashes/lesions/ulcerations overlying the examined area. Radial pulses 2+/4.  No significant generalized UE edema. DP & PT pulses 2+/4.  No significant pretibial edema.  No clubbing or cyanosis Sensation intact to light touch in upper and lower extremities.  Left elbow: Overall well aligned.  No significant bony abnormalities.  He is quite muscular forearm.  He has no focal pain today over the medial epicondyle.  He has a slightly positive Tinel's over the cubital tunnel radiating down into the fourth and fifth fingers but this is minimal.  This does not seem to be the pain that he is experiencing the most.  The lateral aspect pain that he has is hard to delineate but this is over the area of most focal swelling.  He has no pain with bicep hook test no pain with resisted supination but pain with terminal flexion.  There is a small palpable nodule along the distal lateral upper arm but this is nonpainful and reports this being present for quite some time.  Low back: Overall his sit/stand function is improved.  He has good lumbar range of motion.  No lower extremity symptoms and lower extremity strength is symmetric and intact.     [place] Limited Joint Space Structures Up Left(no Linked Charges)  Result Date: 04/30/2017 Andrena Mews, DO     05/01/2017  7:06 AM LIMITED MSK ULTRASOUND OF left elbow Images were obtained and interpreted by myself, Gaspar Bidding, DO Images have been saved and stored to PACS system. Images obtained on: GE S7 Ultrasound machine FINDINGS:  Medial epicondyle has normal-appearing insertion of the flexor tendon bundle.  There is a small amount of increased fibrosis within the flexor tendon  muscle fibers approximately 4-5 cm distal to the insertion consistent with a prior partial tear.  This area is nonpainful form.  The ulnar nerve does sit within the ulnar groove with extension but with flexion there is subluxation with almost complete translocation outside of the ulnar groove with terminal flexion.  Lateral views including of the lateral epicondyles show only minimal hypoechoic change with no focal tendon abnormality that can be appreciated but the biceps tendon and brachioradialis are incompletely visualized due to technical limitations of MSK ultrasound and abundant muscle mass  The small nodule that is appreciated on the anterior lateral upper arm is approximately 1.5 cm in diameter can be appreciated on MSK ultrasound with no increased neovascularity and fairly well-circumscribed nodule within the subcutaneous tissue likely reflective of chronic fat necrosis IMPRESSION: 1. Healed partial tear of the common flexor muscles with normal-appearing common flexor tendon origin. 2. Ulnar nerve subluxation with almost complete transposition  in terminal flexion 3. Subcutaneous nodule in the anterior lateral elbow likely reflective of small area of fat necrosis from likely prior trauma.  Benign-appearing 4. Incompletely visualized elbow flexors   ASSESSMENT & PLAN:     ICD-10-CM   1. Left elbow pain M25.522 [place] LIMITED JOINT SPACE STRUCTURES UP LEFT(NO LINKED CHARGES)    MR ELBOW LEFT WO CONTRAST  2. Chronic bilateral low back pain without sciatica M54.5    G89.29   3. Ulnar neuritis, left G56.22   ================================================================= Ulnar neuritis, left His elbow pain seems to be multifactorial.  Please see the above for further information regarding the lateral pain.  Medial sided pain he is having does not appear to be significant flexor tendinopathy but instead is likely reflective of a subluxing ulnar nerve through the cubital tunnel.  This is appreciated on  MSK ultrasound and if the lateral symptoms with ulnar nerve pain come to symptomatic or further operative intervention is indicated for other elbow issues ulnar transposition could be considered but he is having only minimal ulnar nerve symptoms at this time.   Left elbow pain Multifactorial elbow pain.  Please see the above.  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  The lateral and anterior pain that he is experiencing is clearly delineated.  I am concerned potentially for a partial biceps tear.  He does have a small amount of swelling just at baseline over the anterior arm there may be more of an issue and appreciated on MSK ultrasound.  MRI ordered today for further evaluation.  Of note he does have a small subcutaneous nodule that is likely associated with fat necrosis but there is no concerning features of this but this will be better characterized with the MRI.  Briefly discussed nonoperative options as well including PRP for potential partial tearing  Low back pain His low back pain has improved and is only intermittently a problem for him.  He does have some residual pain but overall is happy with his progress and feels that he can improve his adherence to his exercise program.  He has found benefit with the exercises previously provided. =================================================================   Follow-up: Return for review of MRI.   CMA/ATC served as Neurosurgeon during this visit. History, Physical, and Plan performed by medical provider. Documentation and orders reviewed and attested to.      Gaspar Bidding, DO    [facility] Physician

## 2017-04-30 NOTE — Patient Instructions (Signed)

## 2017-05-01 NOTE — Assessment & Plan Note (Signed)
His elbow pain seems to be multifactorial.  Please see the above for further information regarding the lateral pain.  Medial sided pain he is having does not appear to be significant flexor tendinopathy but instead is likely reflective of a subluxing ulnar nerve through the cubital tunnel.  This is appreciated on MSK ultrasound and if the lateral symptoms with ulnar nerve pain come to symptomatic or further operative intervention is indicated for other elbow issues ulnar transposition could be considered but he is having only minimal ulnar nerve symptoms at this time.

## 2017-05-01 NOTE — Assessment & Plan Note (Signed)
His low back pain has improved and is only intermittently a problem for him.  He does have some residual pain but overall is happy with his progress and feels that he can improve his adherence to his exercise program.  He has found benefit with the exercises previously provided.

## 2017-05-01 NOTE — Assessment & Plan Note (Signed)
Multifactorial elbow pain.  Please see the above.  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  The lateral and anterior pain that he is experiencing is clearly delineated.  I am concerned potentially for a partial biceps tear.  He does have a small amount of swelling just at baseline over the anterior arm there may be more of an issue and appreciated on MSK ultrasound.  MRI ordered today for further evaluation.  Of note he does have a small subcutaneous nodule that is likely associated with fat necrosis but there is no concerning features of this but this will be better characterized with the MRI.  Briefly discussed nonoperative options as well including PRP for potential partial tearing

## 2017-05-01 NOTE — Procedures (Signed)
LIMITED MSK ULTRASOUND OF left elbow Images were obtained and interpreted by myself, Gaspar BiddingMichael Edger Husain, DO  Images have been saved and stored to PACS system. Images obtained on: GE S7 Ultrasound machine  FINDINGS:   Medial epicondyle has normal-appearing insertion of the flexor tendon bundle.  There is a small amount of increased fibrosis within the flexor tendon muscle fibers approximately 4-5 cm distal to the insertion consistent with a prior partial tear.  This area is nonpainful form.  The ulnar nerve does sit within the ulnar groove with extension but with flexion there is subluxation with almost complete translocation outside of the ulnar groove with terminal flexion.  Lateral views including of the lateral epicondyles show only minimal hypoechoic change with no focal tendon abnormality that can be appreciated but the biceps tendon and brachioradialis are incompletely visualized due to technical limitations of MSK ultrasound and abundant muscle mass  The small nodule that is appreciated on the anterior lateral upper arm is approximately 1.5 cm in diameter can be appreciated on MSK ultrasound with no increased neovascularity and fairly well-circumscribed nodule within the subcutaneous tissue likely reflective of chronic fat necrosis  IMPRESSION:  1. Healed partial tear of the common flexor muscles with normal-appearing common flexor tendon origin. 2. Ulnar nerve subluxation with almost complete transposition in terminal flexion 3. Subcutaneous nodule in the anterior lateral elbow likely reflective of small area of fat necrosis from likely prior trauma.  Benign-appearing 4. Incompletely visualized elbow flexors

## 2017-05-17 ENCOUNTER — Other Ambulatory Visit: Payer: BLUE CROSS/BLUE SHIELD

## 2017-05-21 ENCOUNTER — Ambulatory Visit
Admission: RE | Admit: 2017-05-21 | Discharge: 2017-05-21 | Disposition: A | Discharge status: Home / Self Care | Payer: BLUE CROSS/BLUE SHIELD | Source: Ambulatory Visit | Attending: Sports Medicine | Admitting: Sports Medicine

## 2017-05-21 DIAGNOSIS — M25522 Pain in left elbow: Secondary | ICD-10-CM

## 2017-05-28 ENCOUNTER — Telehealth: Payer: Self-pay | Admitting: Sports Medicine

## 2017-05-28 NOTE — Telephone Encounter (Signed)
Patient needs a call back RE referral to Ortho Surgery.  Ty,  -LL

## 2017-05-29 ENCOUNTER — Other Ambulatory Visit: Payer: Self-pay

## 2017-05-29 ENCOUNTER — Ambulatory Visit: Payer: BLUE CROSS/BLUE SHIELD | Admitting: Sports Medicine

## 2017-05-29 DIAGNOSIS — G5622 Lesion of ulnar nerve, left upper limb: Secondary | ICD-10-CM

## 2017-05-29 DIAGNOSIS — M25522 Pain in left elbow: Secondary | ICD-10-CM

## 2017-05-29 NOTE — Telephone Encounter (Signed)
Spoke with patient and he has decided that he would like to be referred to Dr. August Saucerean to discuss surgical options for his elbow. Referral has been placed.

## 2017-06-13 ENCOUNTER — Ambulatory Visit (INDEPENDENT_AMBULATORY_CARE_PROVIDER_SITE_OTHER): Payer: BLUE CROSS/BLUE SHIELD | Admitting: Orthopedic Surgery

## 2017-06-13 ENCOUNTER — Encounter (INDEPENDENT_AMBULATORY_CARE_PROVIDER_SITE_OTHER): Payer: Self-pay | Admitting: Orthopedic Surgery

## 2017-06-13 DIAGNOSIS — G5622 Lesion of ulnar nerve, left upper limb: Secondary | ICD-10-CM | POA: Diagnosis not present

## 2017-06-13 MED ORDER — DICLOFENAC SODIUM 2 % TD SOLN: 1.0000 | Freq: Two times a day (BID) | TRANSDERMAL | 1 refills | Status: DC | PRN

## 2017-06-15 NOTE — Progress Notes (Signed)
Office Visit Note   Patient: Andrew Wiley           Date of Birth: [birth year]           MRN: [id number] Visit Date: 06/13/2017 Requested by: Emi Belfast, FNP [address] [place], [place] [address] PCP: Emi Belfast, FNP  Subjective: Chief Complaint  Patient presents with  . Left Elbow - Pain    HPI: Andrew Wiley is a 36 year old patient with left elbow pain.  He reports chronic elbow pain for years.  He has been seeing Dr. Berline Chough for this problem.  Ultrasound was performed along with MRI scan.  MRI scan was pretty unremarkable in terms of intra-articular or tendinopathy problems in the elbow.  Ultrasound suggested a component of ulnar nerve instability.  Patient denies any specific injury but did play baseball for years and continued to play during college.  Patient states that at times his hand will go numb.  He does write with his right hand at play sports left-handed.  He states that he has no real issues or symptoms with his neck or back.              ROS: All systems reviewed are negative as they relate to the chief complaint within the history of present illness.  Patient denies  fevers or chills.   Assessment & Plan: Visit Diagnoses:  1. Cubital tunnel syndrome on left     Plan: Impression is cubital tunnel syndrome left elbow.  On my examination the ulnar nerve rides up to the equator of the medial condyle but doesn't actually subluxate over and back over the condyle.  I think he does have cubital tunnel syndrome based on his history and exam in absence of any other structural findings on MRI scan.  This problem does not really meet the patient's own admitted clinical criteria for intervention.  In other words it's bothering him but not enough to really do anything about.  To treat this issue I would first want to get nerve conduction study and then potentially consider ulnar nerve transposition in a submuscular fashion which is not a benign or easy procedure to  get over.  His current level of symptoms don't really warrant that type of intervention.  I do want him to try anti-inflammatories topically.  I think that may help with some of the symptoms.  Also avoiding max flexion when he is working out doing bench press deltoid and triceps is advisable.  I will see him back as needed  Follow-Up Instructions: Return if symptoms worsen or fail to improve.   Orders:  No orders of the defined types were placed in this encounter.  Meds ordered this encounter  Medications  . Diclofenac Sodium (PENNSAID) 2 % SOLN    Sig: Place 1 Squirt onto the skin 2 (two) times daily as needed.    Dispense:  1 Bottle    Refill:  1      Procedures: No procedures performed   Clinical Data: No additional findings.  Objective: Vital Signs: There were no vitals taken for this visit.  Physical Exam:   Constitutional: Patient appears well-developed HEENT:  Head: Normocephalic Eyes:EOM are normal Neck: Normal range of motion Cardiovascular: Normal rate Pulmonary/chest: Effort normal Neurologic: Patient is alert Skin: Skin is warm Psychiatric: Patient has normal mood and affect    Ortho Exam: Orthopedic exam demonstrates full active and passive range of motion of the neck.  Patient has 5 out of 5 grip  EPL FPL interosseous wrist flexion as extension biceps triceps and deltoid strength.  No interosseous wasting left versus right.  Positive Tinel's cubital tunnel left negative on the right.  The ulnar nerve does ride up on the medial epicondyle more on the left than the right but does not actually subluxate over the medial epicondyles.  No sensation difference ED and radial or ulnar nerve left versus right hand  Specialty Comments:  No specialty comments available.  Imaging: No results found.   PMFS History: Patient Active Problem List   Diagnosis Date Noted  . Low back pain 04/27/2017  . Ulnar neuritis, left 04/06/2017  . Abscess of left knee 04/05/2017    . Left elbow pain 03/13/2017  . Bilateral varicoceles 03/10/2014   Past Medical History:  Diagnosis Date  . Allergy     Family History  Problem Relation Age of Onset  . Diabetes Maternal Grandmother   . Diabetes Maternal Grandfather   . Hyperlipidemia Father   . Hypertension Father     Past Surgical History:  Procedure Laterality Date  . VASECTOMY    . WISDOM TOOTH EXTRACTION     Social History   Occupational History  . Not on file.   Social History Main Topics  . Smoking status: Never Smoker  . Smokeless tobacco: Former Neurosurgeon  . Alcohol use 2.5 oz/week    5 Standard drinks or equivalent per week  . Drug use: No  . Sexual activity: Not on file

## 2017-07-02 DIAGNOSIS — D1801 Hemangioma of skin and subcutaneous tissue: Secondary | ICD-10-CM | POA: Diagnosis not present

## 2017-07-02 DIAGNOSIS — L821 Other seborrheic keratosis: Secondary | ICD-10-CM | POA: Diagnosis not present

## 2017-07-02 DIAGNOSIS — D225 Melanocytic nevi of trunk: Secondary | ICD-10-CM | POA: Diagnosis not present

## 2017-07-02 DIAGNOSIS — L814 Other melanin hyperpigmentation: Secondary | ICD-10-CM | POA: Diagnosis not present

## 2017-08-12 NOTE — Telephone Encounter (Signed)
No records from [employer] [employer]

## 2017-10-29 ENCOUNTER — Ambulatory Visit (INDEPENDENT_AMBULATORY_CARE_PROVIDER_SITE_OTHER): Payer: BLUE CROSS/BLUE SHIELD | Admitting: Physician Assistant

## 2017-10-29 ENCOUNTER — Encounter: Payer: Self-pay | Admitting: Physician Assistant

## 2017-10-29 VITALS — BP 118/78 | HR 107 | Temp 99.6°F | Resp 16 | Ht 77.0 in | Wt 236.0 lb

## 2017-10-29 DIAGNOSIS — J069 Acute upper respiratory infection, unspecified: Secondary | ICD-10-CM

## 2017-10-29 LAB — POC INFLUENZA A&B (BINAX/QUICKVUE)
INFLUENZA A, POC: NEGATIVE
Influenza B, POC: NEGATIVE

## 2017-10-29 LAB — POCT RAPID STREP A (OFFICE): Rapid Strep A Screen: NEGATIVE

## 2017-10-29 MED ORDER — PREDNISONE 20 MG PO TABS: 20.0000 mg | ORAL_TABLET | Freq: Every day | ORAL | 0 refills | Status: DC

## 2017-10-29 NOTE — Progress Notes (Signed)
Andrew Wiley is a 37 y.o. male here for a new problem.  History of Present Illness:   Chief Complaint  Patient presents with  . Nasal Congestion    HPI   Over the weekend patient started feeling unwell, a few body aches and decreased appetite. Has not had fever at home. Endorses joint pain, aches, hot flashes and congestion. Sore throat and inconsistent cough. Green sputum. Did not receive flu shot this year. Youngest daughter (2 y/o)  had a fever recently, she goes to daycare.  Tried daytime and nighttime cold medication without much relief. No diarrhea or nausea. Is able to push fluids. No recent travel. No recent travel. Denies chest pain, SOB. Good urine output. He would like to r/o flu. No hx of PNA. Reports that today he is feeling slightly better, worst symptom today is nasal congestion.   Past Medical History:  Diagnosis Date  . Allergy      Social History   Socioeconomic History  . Marital status: Married    Spouse name: Not on file  . Number of children: Not on file  . Years of education: Not on file  . Highest education level: Not on file  Social Needs  . Financial resource strain: Not on file  . Food insecurity - worry: Not on file  . Food insecurity - inability: Not on file  . Transportation needs - medical: Not on file  . Transportation needs - non-medical: Not on file  Occupational History  . Not on file  Tobacco Use  . Smoking status: Never Smoker  . Smokeless tobacco: Former Engineer, water and Sexual Activity  . Alcohol use: Yes    Alcohol/week: 2.5 oz    Types: 5 Standard drinks or equivalent per week  . Drug use: No  . Sexual activity: Not on file  Other Topics Concern  . Not on file  Social History Narrative   Married, 3 children   Played sports through college, enjoys working out and Naval architect   Works for [employer] in [employer]    Past Surgical History:  Procedure Laterality Date  . VASECTOMY    . WISDOM TOOTH EXTRACTION      Family  History  Problem Relation Age of Onset  . Diabetes Maternal Grandmother   . Diabetes Maternal Grandfather   . Hyperlipidemia Father   . Hypertension Father     No Known Allergies  Current Medications:   Current Outpatient Medications:  .  cetirizine (ZYRTEC) 10 MG tablet, Take 10 mg by mouth daily., Disp: , Rfl:  .  Diclofenac Sodium (PENNSAID) 2 % SOLN, Place 1 Squirt onto the skin 2 (two) times daily as needed., Disp: 1 Bottle, Rfl: 1 .  omeprazole (PRILOSEC OTC) 20 MG tablet, Take 20 mg by mouth daily., Disp: , Rfl:  .  predniSONE (DELTASONE) 20 MG tablet, Take 1 tablet (20 mg total) by mouth daily with breakfast., Disp: 5 tablet, Rfl: 0   Review of Systems:   Review of Systems  Constitutional: Negative for chills, fever, malaise/fatigue and weight loss.  HENT: Positive for congestion, sinus pain and sore throat.   Respiratory: Positive for cough and sputum production. Negative for shortness of breath.   Cardiovascular: Negative for chest pain, orthopnea, claudication and leg swelling.  Gastrointestinal: Negative for heartburn, nausea and vomiting.  Musculoskeletal: Positive for myalgias.  Neurological: Negative for dizziness, tingling and headaches.    Vitals:   Vitals:   10/29/17 0929  BP: 118/78  Pulse: Marland Kitchen)  107  Resp: 16  Temp: 99.6 F (37.6 C)  TempSrc: Oral  SpO2: 98%  Weight: 236 lb (107 kg)  Height: 6\' 5"  (1.956 m)     Body mass index is 27.99 kg/m.  Physical Exam:   Physical Exam  Constitutional: He appears well-developed. He is cooperative.  Non-toxic appearance. He does not have a sickly appearance. He does not appear ill. No distress.  HENT:  Head: Normocephalic and atraumatic.  Right Ear: Tympanic membrane, external ear and ear canal normal. Tympanic membrane is not erythematous, not retracted and not bulging.  Left Ear: Tympanic membrane, external ear and ear canal normal. Tympanic membrane is not erythematous, not retracted and not bulging.   Nose: Mucosal edema and rhinorrhea present. Right sinus exhibits frontal sinus tenderness. Right sinus exhibits no maxillary sinus tenderness. Left sinus exhibits frontal sinus tenderness. Left sinus exhibits no maxillary sinus tenderness.  Mouth/Throat: Uvula is midline and mucous membranes are normal. No posterior oropharyngeal edema or posterior oropharyngeal erythema. Tonsils are 1+ on the right. Tonsils are 1+ on the left. Tonsillar exudate.  Eyes: Conjunctivae and lids are normal.  Neck: Trachea normal.  Cardiovascular: Regular rhythm, S1 normal, S2 normal and normal heart sounds. Tachycardia present.  Pulmonary/Chest: Effort normal and breath sounds normal. He has no decreased breath sounds. He has no wheezes. He has no rhonchi. He has no rales.  Lymphadenopathy:    He has no cervical adenopathy.  Neurological: He is alert.  Skin: Skin is warm, dry and intact.  Psychiatric: He has a normal mood and affect. His speech is normal and behavior is normal.  Nursing note and vitals reviewed.   Results for orders placed or performed in visit on 10/29/17  POCT rapid strep A  Result Value Ref Range   Rapid Strep A Screen Negative Negative  POC Influenza A&B(BINAX/QUICKVUE)  Result Value Ref Range   Influenza A, POC Negative Negative   Influenza B, POC Negative Negative     Assessment and Plan:    Andrew Wiley was seen today for nasal congestion.  Diagnoses and all orders for this visit:  Upper respiratory tract infection, unspecified type Rapid flu and strep negative. No red flags on exam. Suspect viral URI. Agreeable to 20 mg prednisone daily x 5 days. No indication for antibiotics at this time. Patient is agreeable to plan. Push fluids. Reviewed return to clinic signs/symptoms. -     POCT rapid strep A -     POC Influenza A&B(BINAX/QUICKVUE)  Other orders -     predniSONE (DELTASONE) 20 MG tablet; Take 1 tablet (20 mg total) by mouth daily with breakfast.    . Reviewed  expectations re: course of current medical issues. . Discussed self-management of symptoms. . Outlined signs and symptoms indicating need for more acute intervention. . Patient verbalized understanding and all questions were answered. . See orders for this visit as documented in the electronic medical record. . Patient received an After-Visit Summary.  CMA or LPN served as scribe during this visit. History, Physical, and Plan performed by medical provider. Documentation and orders reviewed and attested to.  Jarold MottoSamantha Waino Mounsey, PA-C

## 2017-10-29 NOTE — Patient Instructions (Addendum)
It was great to meet you!  Take 20 mg oral steroid daily to help with cough and body aches.   If symptoms worsen or persist, please follow-up with [place].  Viral Illness, Adult Viruses are tiny germs that can get into a person's body and cause illness. There are many different types of viruses, and they cause many types of illness. Viral illnesses can range from mild to severe. They can affect various parts of the body. Common illnesses that are caused by a virus include colds and the flu. Viral illnesses also include serious conditions such as HIV/AIDS (human immunodeficiency virus/acquired immunodeficiency syndrome). A few viruses have been linked to certain cancers. What are the causes? Many types of viruses can cause illness. Viruses invade cells in your body, multiply, and cause the infected cells to malfunction or die. When the cell dies, it releases more of the virus. When this happens, you develop symptoms of the illness, and the virus continues to spread to other cells. If the virus takes over the function of the cell, it can cause the cell to divide and grow out of control, as is the case when a virus causes cancer. Different viruses get into the body in different ways. You can get a virus by:  Swallowing food or water that is contaminated with the virus.  Breathing in droplets that have been coughed or sneezed into the air by an infected person.  Touching a surface that has been contaminated with the virus and then touching your eyes, nose, or mouth.  Being bitten by an insect or animal that carries the virus.  Having sexual contact with a person who is infected with the virus.  Being exposed to blood or fluids that contain the virus, either through an open cut or during a transfusion.  If a virus enters your body, your body's defense system (immune system) will try to fight the virus. You may be at higher risk for a viral illness if your immune system is weak. What are the signs or  symptoms? Symptoms vary depending on the type of virus and the location of the cells that it invades. Common symptoms of the main types of viral illnesses include: Cold and flu viruses  Fever.  Headache.  Sore throat.  Muscle aches.  Nasal congestion.  Cough. Digestive system (gastrointestinal) viruses  Fever.  Abdominal pain.  Nausea.  Diarrhea. Liver viruses (hepatitis)  Loss of appetite.  Tiredness.  Yellowing of the skin (jaundice). Brain and spinal cord viruses  Fever.  Headache.  Stiff neck.  Nausea and vomiting.  Confusion or sleepiness. Skin viruses  Warts.  Itching.  Rash. Sexually transmitted viruses  Discharge.  Swelling.  Redness.  Rash. How is this treated? Viruses can be difficult to treat because they live within cells. Antibiotic medicines do not treat viruses because these drugs do not get inside cells. Treatment for a viral illness may include:  Resting and drinking plenty of fluids.  Medicines to relieve symptoms. These can include over-the-counter medicine for pain and fever, medicines for cough or congestion, and medicines to relieve diarrhea.  Antiviral medicines. These drugs are available only for certain types of viruses. They may help reduce flu symptoms if taken early. There are also many antiviral medicines for hepatitis and HIV/AIDS.  Some viral illnesses can be prevented with vaccinations. A common example is the flu shot. Follow these instructions at home: Medicines   Take over-the-counter and prescription medicines only as told by your health care provider.  If you were prescribed an antiviral medicine, take it as told by your health care provider. Do not stop taking the medicine even if you start to feel better.  Be aware of when antibiotics are needed and when they are not needed. Antibiotics do not treat viruses. If your health care provider thinks that you may have a bacterial infection as well as a viral  infection, you may get an antibiotic. ? Do not ask for an antibiotic prescription if you have been diagnosed with a viral illness. That will not make your illness go away faster. ? Frequently taking antibiotics when they are not needed can lead to antibiotic resistance. When this develops, the medicine no longer works against the bacteria that it normally fights. General instructions  Drink enough fluids to keep your urine clear or pale yellow.  Rest as much as possible.  Return to your normal activities as told by your health care provider. Ask your health care provider what activities are safe for you.  Keep all follow-up visits as told by your health care provider. This is important. How is this prevented? Take these actions to reduce your risk of viral infection:  Eat a healthy diet and get enough rest.  Wash your hands often with soap and water. This is especially important when you are in public places. If soap and water are not available, use hand sanitizer.  Avoid close contact with friends and family who have a viral illness.  If you travel to areas where viral gastrointestinal infection is common, avoid drinking water or eating raw food.  Keep your immunizations up to date. Get a flu shot every year as told by your health care provider.  Do not share toothbrushes, nail clippers, razors, or needles with other people.  Always practice safe sex.  Contact a health care provider if:  You have symptoms of a viral illness that do not go away.  Your symptoms come back after going away.  Your symptoms get worse. Get help right away if:  You have trouble breathing.  You have a severe headache or a stiff neck.  You have severe vomiting or abdominal pain. This information is not intended to replace advice given to you by your health care provider. Make sure you discuss any questions you have with your health care provider. Document Released: 01/13/2016 Document Revised:  02/15/2016 Document Reviewed: 01/13/2016 Elsevier Interactive Patient Education  [employer]2018 [employer]

## 2017-11-01 ENCOUNTER — Encounter: Payer: Self-pay | Admitting: Physician Assistant

## 2017-11-01 ENCOUNTER — Other Ambulatory Visit: Payer: Self-pay | Admitting: Physician Assistant

## 2017-11-01 MED ORDER — AMOXICILLIN-POT CLAVULANATE 875-125 MG PO TABS: 1.0000 | ORAL_TABLET | Freq: Two times a day (BID) | ORAL | 0 refills | Status: DC

## 2017-12-02 ENCOUNTER — Encounter: Payer: Self-pay | Admitting: Physician Assistant

## 2017-12-04 ENCOUNTER — Encounter: Payer: Self-pay | Admitting: Physician Assistant

## 2017-12-04 ENCOUNTER — Ambulatory Visit (INDEPENDENT_AMBULATORY_CARE_PROVIDER_SITE_OTHER): Payer: BLUE CROSS/BLUE SHIELD

## 2017-12-04 ENCOUNTER — Ambulatory Visit (INDEPENDENT_AMBULATORY_CARE_PROVIDER_SITE_OTHER): Payer: BLUE CROSS/BLUE SHIELD | Admitting: Physician Assistant

## 2017-12-04 VITALS — BP 130/80 | HR 87 | Temp 98.3°F | Ht 77.0 in | Wt 237.4 lb

## 2017-12-04 DIAGNOSIS — K219 Gastro-esophageal reflux disease without esophagitis: Secondary | ICD-10-CM | POA: Diagnosis not present

## 2017-12-04 DIAGNOSIS — R079 Chest pain, unspecified: Secondary | ICD-10-CM | POA: Diagnosis not present

## 2017-12-04 DIAGNOSIS — R0789 Other chest pain: Secondary | ICD-10-CM

## 2017-12-04 LAB — CBC WITH DIFFERENTIAL/PLATELET
BASOS ABS: 0 10*3/uL (ref 0.0–0.1)
BASOS PCT: 0.5 % (ref 0.0–3.0)
Eosinophils Absolute: 0.1 10*3/uL (ref 0.0–0.7)
Eosinophils Relative: 1.7 % (ref 0.0–5.0)
HEMATOCRIT: 41.9 % (ref 39.0–52.0)
Hemoglobin: 13.9 g/dL (ref 13.0–17.0)
LYMPHS ABS: 1.8 10*3/uL (ref 0.7–4.0)
Lymphocytes Relative: 32.5 % (ref 12.0–46.0)
MCHC: 33.3 g/dL (ref 30.0–36.0)
MCV: 88 fl (ref 78.0–100.0)
Monocytes Absolute: 0.4 10*3/uL (ref 0.1–1.0)
Monocytes Relative: 7.2 % (ref 3.0–12.0)
NEUTROS ABS: 3.2 10*3/uL (ref 1.4–7.7)
NEUTROS PCT: 58.1 % (ref 43.0–77.0)
PLATELETS: 342 10*3/uL (ref 150.0–400.0)
RBC: 4.76 Mil/uL (ref 4.22–5.81)
RDW: 13.4 % (ref 11.5–15.5)
WBC: 5.6 10*3/uL (ref 4.0–10.5)

## 2017-12-04 LAB — COMPREHENSIVE METABOLIC PANEL
ALT: 32 U/L (ref 0–53)
AST: 22 U/L (ref 0–37)
Albumin: 5 g/dL (ref 3.5–5.2)
Alkaline Phosphatase: 54 U/L (ref 39–117)
BILIRUBIN TOTAL: 0.2 mg/dL (ref 0.2–1.2)
BUN: 17 mg/dL (ref 6–23)
CALCIUM: 9.7 mg/dL (ref 8.4–10.5)
CHLORIDE: 108 meq/L (ref 96–112)
CO2: 24 mEq/L (ref 19–32)
Creatinine, Ser: 0.86 mg/dL (ref 0.40–1.50)
GFR: 105.52 mL/min (ref >60.00)
GLUCOSE: 116 mg/dL — AB (ref 70–99)
POTASSIUM: 4.5 meq/L (ref 3.5–5.1)
Sodium: 144 mEq/L (ref 135–145)
Total Protein: 7.5 g/dL (ref 6.0–8.3)

## 2017-12-04 LAB — TSH: TSH: 1.7 u[IU]/mL (ref 0.35–4.50)

## 2017-12-04 MED ORDER — RANITIDINE HCL 150 MG PO TABS: 150.0000 mg | ORAL_TABLET | Freq: Two times a day (BID) | ORAL | 1 refills | Status: AC

## 2017-12-04 NOTE — Progress Notes (Signed)
Andrew AlamoMatthew R Wiley is a 37 y.o. male here for recurrent problem.  I acted as a Neurosurgeonscribe for [employer] CorporationSamantha Gwyn Mehring, PA-C Corky Mullonna Orphanos, LPN  History of Present Illness:   Chief Complaint  Patient presents with  . Muscle soreness    in chest area x 1-2 years, ? reflux  . slight burning in esophagus area    HPI   He is here to discuss his issues with "muscle soreness in his chest." Has had chest xrays and EKGs performed in the past by an Urgent Care a couple of years ago, states that everything was "normal." Reports that he is having daily "muscle soreness". Feels this pain after activity, such as doing yard work or shoveling. Pain goes to the middle of his chest and travels up to his L side chest and L arm. Has been told by past providers that it may be GERD, takes Prilosec prn. When he takes Prilosec, he does not take on an empty stomach. Does have some sensation of burning in his throat the morning after eating late at night. Can get occasional neck stiffness that may be related to it.  He also reports that he drinks "lots and lots" of coffee every morning.  Denies SOB, lower leg pain, recent travel.  Past Medical History:  Diagnosis Date  . Allergy      Social History   Socioeconomic History  . Marital status: Married    Spouse name: Not on file  . Number of children: Not on file  . Years of education: Not on file  . Highest education level: Not on file  Social Needs  . Financial resource strain: Not on file  . Food insecurity - worry: Not on file  . Food insecurity - inability: Not on file  . Transportation needs - medical: Not on file  . Transportation needs - non-medical: Not on file  Occupational History  . Not on file  Tobacco Use  . Smoking status: Never Smoker  . Smokeless tobacco: Former Engineer, [employer]  Substance and Sexual Activity  . Alcohol use: Yes    Alcohol/week: 2.5 oz    Types: 5 Standard drinks or equivalent per week  . Drug use: No  . Sexual activity: Not on file   Other Topics Concern  . 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 VASECTOMY    . WISDOM TOOTH EXTRACTION      Family History  Problem Relation Age of Onset  . Diabetes Maternal Grandmother   . Diabetes Maternal Grandfather   . Hyperlipidemia Father   . Hypertension Father     No Known Allergies  Current Medications:   Current Outpatient Medications:  .  cetirizine (ZYRTEC) 10 MG tablet, Take 10 mg by mouth daily., Disp: , Rfl:  .  Diclofenac Sodium (PENNSAID) 2 % SOLN, Place 1 Squirt onto the skin 2 (two) times daily as needed., Disp: 1 Bottle, Rfl: 1 .  ranitidine (ZANTAC) 150 MG tablet, Take 1 tablet (150 mg total) by mouth 2 (two) times daily., Disp: 60 tablet, Rfl: 1   Review of Systems:   ROS  Negative unless otherwise specified per HPI.   Vitals:   Vitals:   12/04/17 1041  BP: 130/80  Pulse: 87  Temp: 98.3 F (36.8 C)  TempSrc: Oral  SpO2: 96%  Weight: 237 lb 6.1 oz (107.7 kg)  Height: 6\' 5"  (1.956 m)     Body mass index is 28.15 kg/m.  Physical Exam:   Physical Exam  Constitutional: He appears well-developed. He is cooperative.  Non-toxic appearance. He does not have a sickly appearance. He does not appear ill. No distress.  Cardiovascular: Normal rate, regular rhythm, S1 normal, S2 normal, normal heart sounds and normal pulses.  No LE edema  Pulmonary/Chest: Effort normal and breath sounds normal.  No reproducible chest tenderness with palpation; no bony tenderness, no rashes or ecchymosis  Abdominal: Normal appearance and bowel sounds are normal. There is no tenderness.  Neurological: He is alert. GCS eye subscore is 4. GCS verbal subscore is 5. GCS motor subscore is 6.  Skin: Skin is warm, dry and intact.  Psychiatric: He has a normal mood and affect. His speech is normal  and behavior is normal.  Nursing note and vitals reviewed.  CXR: pending  EKG tracing is personally reviewed.  EKG notes NSR.  No acute changes.    Assessment and Plan:    Andrew Wiley was seen today for muscle soreness and slight burning in esophagus area.  Diagnoses and all orders for this visit:  Chest discomfort, GERD No red flags on exam. This is a chronic issue for patient. EKG tracing is personally reviewed.  EKG notes NSR.  No acute changes. CXR pending. I have also obtained labs. I believe that this is multifactorial: significant caffeine intake, late night eating, poor compliance with GERD medications. Possible MSK-origin of symptoms as well. Discussed starting Zantac 150 mg BID x 1 month. Follow-up for review of medication efficacy. If chest pain worsens or new symptoms develops, needs to seek medical attention -- patient verbalized understanding. Discussed need to cut down coffee in the morning. -     EKG 12-Lead -     DG Chest 2 View; Future -     CBC with Differential/Platelet -     Comprehensive metabolic panel -     TSH   Other orders -     ranitidine (ZANTAC) 150 MG tablet; Take 1 tablet (150 mg total) by mouth 2 (two) times daily.    . Reviewed expectations re: course of current medical issues. . Discussed self-management of symptoms. . Outlined signs and symptoms indicating need for more acute intervention. . Patient verbalized understanding and all questions were answered. . See orders for this visit as documented in the electronic medical record. . Patient received an After-Visit Summary.  CMA or LPN served as scribe during this visit. History, Physical, and Plan performed by medical provider. Documentation and orders reviewed and attested to.  Jarold Motto, PA-C

## 2017-12-04 NOTE — Patient Instructions (Signed)
It was great to see you!  Start Ranitidine 150 mg twice daily (30 minutes before meals!) x 1 month. Then decrease to 150 mg daily.  Follow-up with us in 4-6 weeks, sooner if needed, so we can see how you are doing.  If your chest pain changes in any way, please go to the ER!  Please decrease caffeine intake!

## 2017-12-05 ENCOUNTER — Encounter: Payer: Self-pay | Admitting: Physician Assistant

## 2018-01-01 ENCOUNTER — Encounter: Payer: Self-pay | Admitting: Physician Assistant

## 2018-01-01 ENCOUNTER — Ambulatory Visit: Payer: BLUE CROSS/BLUE SHIELD | Admitting: Physician Assistant

## 2018-01-01 DIAGNOSIS — Z029 Encounter for administrative examinations, unspecified: Secondary | ICD-10-CM

## 2018-01-09 ENCOUNTER — Encounter: Payer: Self-pay | Admitting: Family Medicine

## 2018-01-09 ENCOUNTER — Ambulatory Visit (INDEPENDENT_AMBULATORY_CARE_PROVIDER_SITE_OTHER): Payer: BLUE CROSS/BLUE SHIELD | Admitting: Family Medicine

## 2018-01-09 ENCOUNTER — Encounter: Payer: Self-pay | Admitting: Physician Assistant

## 2018-01-09 VITALS — BP 112/80 | HR 80 | Ht 76.0 in | Wt 237.0 lb

## 2018-01-09 DIAGNOSIS — J029 Acute pharyngitis, unspecified: Secondary | ICD-10-CM

## 2018-01-09 LAB — POCT RAPID STREP A (OFFICE): Rapid Strep A Screen: NEGATIVE

## 2018-01-09 MED ORDER — PREDNISONE 50 MG PO TABS: ORAL_TABLET | ORAL | 0 refills | Status: DC

## 2018-01-09 MED ORDER — AMOXICILLIN 875 MG PO TABS: 875.0000 mg | ORAL_TABLET | Freq: Two times a day (BID) | ORAL | 0 refills | Status: DC

## 2018-01-09 MED ORDER — IPRATROPIUM BROMIDE 0.06 % NA SOLN: 2.0000 | Freq: Four times a day (QID) | NASAL | 0 refills | Status: DC

## 2018-01-09 NOTE — Patient Instructions (Signed)
Start the atrovent and prednisone  Start the amoxicillin if your symptoms worsen or do not improve in a few days.  Please stay well hydrated.  You can take tylenol and/or motrin as needed for low grade fever and pain.  Please let me know if your symptoms worsen or fail to improve.  Take care, Dr Jimmey RalphParker

## 2018-01-09 NOTE — Progress Notes (Signed)
    Subjective:  Andrew Wiley is a 37 y.o. male who presents today for same-day appointment with a chief complaint of sore throat.   HPI:  Sore Throat, Acute Issue Started about a week ago. Initially improved after a few days, however worsened recently.  Associated with headache, cough, and malaise.  No rhinorrhea.  Occasional night sweats.  No fevers.  Daughter recently diagnosed with strep throat along with the rest of her school class.  No other obvious alleviating or aggravating factors.  ROS: Per HPI  PMH: He reports that he has never smoked. He has quit using smokeless tobacco. He reports that he drinks about 2.5 oz of alcohol per week. He reports that he does not use drugs.  Objective:  Physical Exam: BP 112/80 (BP Location: Left Arm)   Pulse 80   Ht 6\' 4"  (1.93 m)   Wt 237 lb (107.5 kg)   SpO2 98%   BMI 28.85 kg/m   Gen: NAD, resting comfortably HEENT: TMs clear bilaterally.  Oropharynx slightly erythematous without exudate.  No lymphadenopathy. CV: RRR with no murmurs appreciated Pulm: NWOB, CTAB with no crackles, wheezes, or rhonchi  Rapid strep: Negative  Assessment/Plan:  Sore throat Likely secondary to viral URI versus allergies. No signs of bacterial infection.  Rapid strep negative.  Start atrovent for rhinorrhea/sinus congestion. Will give prednisone burst for sore throat.  Sent in a "pocket prescription" for amoxicillin with strict instruction to not start unless symptoms worsen or fail to improve within the next several days. Recommended tylenol and/or motrin as needed for low grade fever and pain. Encouraged good oral hydration. Return precautions reviewed. Follow up as needed.    Katina Degreealeb M. Jimmey RalphParker, MD 01/09/2018 2:23 PM

## 2018-03-30 NOTE — Progress Notes (Deleted)
   Andrew AlamoMatthew R Wiley is a 37 y.o. male here for an acute visit.  History of Present Illness:   {CMA SCRIBE ATTESTATION}  HPI:   PMHx, SurgHx, SocialHx, Medications, and Allergies were reviewed in the Visit Navigator and updated as appropriate.  Current Medications:   Current Outpatient Medications:  .  amoxicillin (AMOXIL) 875 MG tablet, Take 1 tablet (875 mg total) by mouth 2 (two) times daily., Disp: 14 tablet, Rfl: 0 .  cetirizine (ZYRTEC) 10 MG tablet, Take 10 mg by mouth daily., Disp: , Rfl:  .  Diclofenac Sodium (PENNSAID) 2 % SOLN, Place 1 Squirt onto the skin 2 (two) times daily as needed., Disp: 1 Bottle, Rfl: 1 .  ipratropium (ATROVENT) 0.06 % nasal spray, Place 2 sprays into both nostrils 4 (four) times daily., Disp: 15 mL, Rfl: 0 .  predniSONE (DELTASONE) 50 MG tablet, Take 1 tablet daily for 2 days., Disp: 2 tablet, Rfl: 0 .  ranitidine (ZANTAC) 150 MG tablet, Take 1 tablet (150 mg total) by mouth 2 (two) times daily., Disp: 60 tablet, Rfl: 1   No Known Allergies Review of Systems:   Pertinent items are noted in the HPI. Otherwise, ROS is negative.  Vitals:  There were no vitals filed for this visit.   There is no height or weight on file to calculate BMI.  Physical Exam:   Physical Exam  Results for orders placed or performed in visit on 01/09/18  POCT rapid strep A  Result Value Ref Range   Rapid Strep A Screen Negative Negative    Assessment and Plan:   There are no diagnoses linked to this encounter.  . Reviewed expectations re: course of current medical issues. . Discussed self-management of symptoms. . Outlined signs and symptoms indicating need for more acute intervention. . Patient verbalized understanding and all questions were answered. Marland Kitchen. Health Maintenance issues including appropriate healthy diet, exercise, and smoking avoidance were discussed with patient. . See orders for this visit as documented in the electronic medical record. . Patient  received an After Visit Summary.  *** CMA served as Neurosurgeonscribe during this visit. History, Physical, and Plan performed by medical provider. The above documentation has been reviewed and is accurate and complete. Helane RimaErica Wallace, D.O.  Helane RimaErica Wallace, DO [place] 03/30/2018

## 2018-03-31 ENCOUNTER — Encounter: Payer: Self-pay | Admitting: Physician Assistant

## 2018-03-31 ENCOUNTER — Ambulatory Visit (INDEPENDENT_AMBULATORY_CARE_PROVIDER_SITE_OTHER): Payer: BLUE CROSS/BLUE SHIELD | Admitting: Physician Assistant

## 2018-03-31 VITALS — BP 120/78 | HR 73 | Temp 98.5°F | Ht 76.0 in | Wt 236.0 lb

## 2018-03-31 DIAGNOSIS — R229 Localized swelling, mass and lump, unspecified: Secondary | ICD-10-CM

## 2018-03-31 MED ORDER — CYCLOBENZAPRINE HCL 5 MG PO TABS: 5.0000 mg | ORAL_TABLET | Freq: Three times a day (TID) | ORAL | 0 refills | Status: DC | PRN

## 2018-03-31 NOTE — Patient Instructions (Addendum)
It was great to see you.  Follow-up with Dr. Berline Choughigby if needed -- for further evaluation and/or reassurance.  May use ibuprofen for anti-inflammatory.  Avoid touching area to see if that helps to calm it down.  Try gentle stretches and rest.

## 2018-03-31 NOTE — Progress Notes (Signed)
Andrew Wiley is a 37 y.o. male here to Establish Care and Lump on chest.  I acted as a Neurosurgeonscribe for [employer]antha Laticha Ferrucci, PA-C Corky Mullonna Orphanos, LPN  History of Present Illness:   Chief Complaint  Patient presents with  . Establish Care  . Bump on chest    Left side under clavicle    Acute Concerns: Bump on L side under clavicle -- patient reports that on Thursday he was playing wiffle ball with his daughter. He states that he played "pretty hard" with her. He noticed after playing with her that he felt a "bump" on the left side of his chest under his clavicle. He denies worsening GI or cardiac symptoms. He is eating and drinking well. Denies chest pain, SOB, unintentional weight changes, or fevers. Has not tried any medication for this at this time. He has been palpating it often since he has noticed it.   Wt Readings from Last 5 Encounters:  03/31/18 236 lb (107 kg)  01/09/18 237 lb (107.5 kg)  12/04/17 237 lb 6.1 oz (107.7 kg)  10/29/17 236 lb (107 kg)  04/30/17 238 lb 6.4 oz (108.1 kg)    Health Maintenance: Weight -- Weight: 236 lb (107 kg)   Depression screen PHQ 2/9 03/08/2017  Decreased Interest 0  Down, Depressed, Hopeless 0  PHQ - 2 Score 0    No flowsheet data found.    Past Medical History:  Diagnosis Date  . Allergy      Social History   Socioeconomic History  . Marital status: Married    Spouse name: Not on file  . Number of children: Not on file  . Years of education: Not on file  . Highest education level: Not on file  Occupational History  . Not on file  Social Needs  . Financial resource strain: Not on file  . Food insecurity:    Worry: Not on file    Inability: Not on file  . Transportation needs:    Medical: Not on file    Non-medical: Not on file  Tobacco Use  . Smoking status: Never Smoker  . Smokeless tobacco: Former Engineer, waterUser  Substance and Sexual Activity  . Alcohol use: Yes    Alcohol/week: 3.0 oz    Types: 5 Standard drinks or  equivalent per week  . Drug use: No  . Sexual activity: Not on file  Lifestyle  . Physical activity:    Days per week: Not on file    Minutes per session: Not on file  . Stress: Not on file  Relationships  . Social connections:    Talks on phone: Not on file    Gets together: Not on file    Attends religious service: Not on file    Active member of club or organization: Not on file    Attends meetings of clubs or organizations: Not on file    Relationship status: Not on file  . Intimate partner violence:    Fear of current or ex partner: Not on file    Emotionally abused: Not on file    Physically abused: Not on file    Forced sexual activity: Not on file  Other Topics Concern  . 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 VASECTOMY    .  WISDOM TOOTH EXTRACTION      Family History  Problem Relation Age of Onset  . Diabetes Maternal Grandmother   . Diabetes Maternal Grandfather   . Hyperlipidemia Father   . Hypertension Father   . Colon cancer Neg Hx   . Prostate cancer Neg Hx     No Known Allergies   Current Medications:   Current Outpatient Medications:  .  CALCIUM CARBONATE PO, Take 1 tablet by mouth daily., Disp: , Rfl:  .  cetirizine (ZYRTEC) 10 MG tablet, Take 10 mg by mouth daily., Disp: , Rfl:  .  Cholecalciferol (VITAMIN D PO), Take 2 each by mouth daily., Disp: , Rfl:  .  Multiple Vitamin (MULTIVITAMIN) tablet, Take 1 tablet by mouth daily., Disp: , Rfl:  .  ranitidine (ZANTAC) 150 MG tablet, Take 1 tablet (150 mg total) by mouth 2 (two) times daily., Disp: 60 tablet, Rfl: 1 .  cyclobenzaprine (FLEXERIL) 5 MG tablet, Take 1 tablet (5 mg total) by mouth 3 (three) times daily as needed for muscle spasms., Disp: 20 tablet, Rfl: 0 .  Diclofenac Sodium (PENNSAID) 2 % SOLN,  Place 1 Squirt onto the skin 2 (two) times daily as needed. (Patient not taking: Reported on 03/31/2018), Disp: 1 Bottle, Rfl: 1   Review of Systems:   ROS  Negative unless otherwise specified per HPI.   Vitals:   Vitals:   03/31/18 1347  BP: 120/78  Pulse: 73  Temp: 98.5 F (36.9 C)  TempSrc: Oral  SpO2: 98%  Weight: 236 lb (107 kg)  Height: 6\' 4"  (1.93 m)     Body mass index is 28.73 kg/m.  Physical Exam:   Physical Exam  Constitutional: He appears well-developed. He is cooperative.  Non-toxic appearance. He does not have a sickly appearance. He does not appear ill. No distress.  Cardiovascular: Normal rate, regular rhythm, S1 normal, S2 normal, normal heart sounds and normal pulses.  No LE edema  Pulmonary/Chest: Effort normal and breath sounds normal.    Neurological: He is alert. GCS eye subscore is 4. GCS verbal subscore is 5. GCS motor subscore is 6.  Skin: Skin is warm, dry and intact.  Psychiatric: He has a normal mood and affect. His speech is normal and behavior is normal.  Nursing note and vitals reviewed.   Assessment and Plan:    Andrew Wiley was seen today for establish care and bump on chest.  Diagnoses and all orders for this visit:  Localized skin mass, lump, or swelling  Other orders -     cyclobenzaprine (FLEXERIL) 5 MG tablet; Take 1 tablet (5 mg total) by mouth 3 (three) times daily as needed for muscle spasms.   Briefly discussed with Dr. Gaspar Bidding. Patient with possible muscle strain. Suggested that he avoids touching the area to see if this helps with swelling and irritation. May try ibuprofen for inflammation and flexeril to help with possible spasm. If symptoms do not resolve, if they worsen, or if he has worsening symptoms, I recommended evaluation with Dr. Berline Chough for further imaging and evaluation. Patient is agreeable to plan.   . Reviewed expectations re: course of current medical issues. . Discussed self-management of  symptoms. . Outlined signs and symptoms indicating need for more acute intervention. . Patient verbalized understanding and all questions were answered. . See orders for this visit as documented in the electronic medical record. . Patient received an After-Visit Summary.   CMA or LPN served as scribe during this visit. History, Physical, and  Plan performed by medical provider. Documentation and orders reviewed and attested to.    Jarold Motto, PA-C

## 2018-04-04 ENCOUNTER — Ambulatory Visit (INDEPENDENT_AMBULATORY_CARE_PROVIDER_SITE_OTHER): Payer: BLUE CROSS/BLUE SHIELD | Admitting: Sports Medicine

## 2018-04-04 ENCOUNTER — Ambulatory Visit: Payer: Self-pay

## 2018-04-04 ENCOUNTER — Encounter: Payer: Self-pay | Admitting: Sports Medicine

## 2018-04-04 VITALS — BP 110/78 | HR 88 | Ht 76.0 in | Wt 230.0 lb

## 2018-04-04 DIAGNOSIS — M9901 Segmental and somatic dysfunction of cervical region: Secondary | ICD-10-CM

## 2018-04-04 DIAGNOSIS — G540 Brachial plexus disorders: Secondary | ICD-10-CM | POA: Insufficient documentation

## 2018-04-04 DIAGNOSIS — M9908 Segmental and somatic dysfunction of rib cage: Secondary | ICD-10-CM

## 2018-04-04 DIAGNOSIS — M9902 Segmental and somatic dysfunction of thoracic region: Secondary | ICD-10-CM | POA: Diagnosis not present

## 2018-04-04 DIAGNOSIS — M9907 Segmental and somatic dysfunction of upper extremity: Secondary | ICD-10-CM

## 2018-04-04 DIAGNOSIS — M19012 Primary osteoarthritis, left shoulder: Secondary | ICD-10-CM

## 2018-04-04 DIAGNOSIS — R229 Localized swelling, mass and lump, unspecified: Secondary | ICD-10-CM

## 2018-04-04 NOTE — Patient Instructions (Signed)
Also check out [employer]"Foundation Training" which is a program developed by Dr. Myles LippsEric Goodman.   There are links to a couple of his YouTube Videos below and I would like to see performing one of his videos 5-6 days per week.    A good intro video is: "Independence from Pain 7-minute Video" - [email or web address]   Exercises that focus more on the neck are as below: Dr. Derrill KayGoodman with [employer] [employer] teaching neck and shoulder details Part 1 - [email or web address] Part 2 Dr. Derrill KayGoodman with [facility] quick routine to practice daily - [email or web address]  Do not try to attempt the entire video when first beginning.    Try breaking of each exercise that he goes into shorter segments.  Otherwise if they perform an exercise for 45 seconds, start with 15 seconds and rest and then resume when they begin the new activity.  If you work your way up to being able to do these videos without having to stop, I expect you will see significant improvements in your pain.  If you enjoy his videos and would like to find out more you can look on his website: [email or web address].  He has a workout streaming option as well as a DVD set available for purchase.  Amazon has the best price for his DVDs.     Please perform the exercise program that we have prepared for you and gone over in detail on a daily basis.  In addition to the handout you were provided you can access your program through: [email or web address]   Your unique program code is:  [email or web address]

## 2018-04-04 NOTE — Progress Notes (Signed)
PROCEDURE NOTE : OSTEOPATHIC MANIPULATION The decision today to treat with Osteopathic Manipulative Therapy (OMT) was based on physical exam findings. Verbal consent was obtained following a discussion with the patient regarding the of risks, benefits and potential side effects, including an acute pain flare,post manipulation soreness and need for repeat treatments. Additionally, we specifically discussed the minimal risk of  injury to neurovascular structures associated with Cervical manipulation.   Contraindications to OMT reviewed and include: NONE  Manipulation was performed as below: Regions treated: Cervical spine, Ribs, Thoracic spine and Upper extremities OMT Techniques Used: HVLA, muscle energy, myofascial release, soft tissue and facilitated positional release  The patient tolerated the treatment well and reported Improved symptoms following treatment today. Patient was given medications, exercises, stretches and lifestyle modifications per AVS and verbally.   OSTEOPATHIC/STRUCTURAL EXAM:   C2 through C4 FRS left C5 FRS right T2 - T5 neutral side bent right, rotated left Ribs 6 posterior right Clifton joint is elevated and immobile on the left Glenohumeral joint is internally rotated.  Limited external rotation

## 2018-04-04 NOTE — Progress Notes (Signed)
Andrew Wiley. Andrew [facility] at [facility] [phone]  Andrew Wiley - 37 y.o. male MRN [id number]  Date of birth: 1981/08/04  Visit Date: 04/04/2018  PCP: Helane Rima, DO   Referred by: Helane Rima, DO  Scribe(s) for today's visit: Christoper Fabian, LAT, ATC  SUBJECTIVE:  Andrew Wiley is here for Initial Assessment (L chest bump/lump) .  Referred by: Andrew Cloud, PA  His L chest /clavicular pain symptoms INITIALLY: Began about a week ago when he noticed the lump/bump for the first time.  He noticed it after having played wiffle ball w/ his daughter but doesn't necessarily think the bump is associated w/ that.  He is LHD for some activities. Described as non-painful,  radiating to chest and L upper arm Worsened with working out his upper body Improved with rest/stopping activity Additional associated symptoms include: N/T into the L UE; pain in chest w/ sneezing    At this time symptoms show no change compared to onset  He has not been doing anything in particular for this current c/o.   REVIEW OF SYSTEMS: Denies night time disturbances. Denies fevers, chills, or night sweats. Denies unexplained weight loss. Denies personal history of cancer. Denies changes in bowel or bladder habits. Denies recent unreported falls. Denies new or worsening dyspnea or wheezing. Reports headaches or dizziness.  Reports numbness, tingling or weakness  In the extremities - in the L UE Denies dizziness or presyncopal episodes Denies lower extremity edema    HISTORY & PERTINENT PRIOR DATA:  Prior History reviewed and updated per electronic medical record.  Significant/pertinent history, findings, studies include:  reports that he has never smoked. He has quit using smokeless tobacco. No results for input(s): HGBA1C, LABURIC, CREATINE in the last 8760 hours. No specialty comments available. Problem  Thoracic Outlet Syndrome of Left  Thoracic Outlet  Arthritis of Left Sternoclavicular Joint    OBJECTIVE:  VS:  HT:6\' 4"  (193 cm)   WT:230 lb (104.3 kg)  BMI:28.01    BP:110/78  HR:88bpm  TEMP: ( )  RESP:97 %    Wt Readings from Last 10 Encounters:  04/04/18 230 lb (104.3 kg)  03/31/18 236 lb (107 kg)  01/09/18 237 lb (107.5 kg)  12/04/17 237 lb 6.1 oz (107.7 kg)  10/29/17 236 lb (107 kg)  04/30/17 238 lb 6.4 oz (108.1 kg)  03/26/17 235 lb 6.4 oz (106.8 kg)  03/13/17 239 lb 12.8 oz (108.8 kg)  03/08/17 239 lb 9.6 oz (108.7 kg)  08/16/16 230 lb (104.3 kg)   PHYSICAL EXAM: Constitutional: WDWN, Non-toxic appearing. Psychiatric: Alert & appropriately interactive.  Not depressed or anxious appearing. Respiratory: No increased work of breathing.  Trachea Midline Eyes: Pupils are equal.  EOM intact without nystagmus.  No scleral icterus  Vascular Exam: warm to touch no edema  upper extremity neuro exam: unremarkable  MSK Exam: Diminished pulses with Nydia Bouton testing with pec stretching.  He has external rotation deficit of the left upper extremity.  Mild palpable nodularity at the point that is slightly painful.  Poor [place] joint motion with overhead motion.  Shoulder is held in protraction.  Good internal and external rotation strength.  No significant pain with rotator cuff testing and negative speeds test and O'Brien's test.   ASSESSMENT & PLAN:   1. Localized skin mass, lump, or swelling   2. Thoracic outlet syndrome of left thoracic outlet   3. Somatic dysfunction of cervical region   4. Somatic  dysfunction of thoracic region   5. Somatic dysfunction of rib cage region   6. Somatic dysfunction of upper extremity   7. Arthritis of left sternoclavicular joint     PLAN: Osteopathic manipulation was performed today based on physical exam findings.  Please see procedure note for further information including Osteopathic Exam findings  Discussed the foundation of treatment for this condition is physical  therapy and/or daily (5-6 days/week) therapeutic exercises, focusing on core strengthening, coordination, neuromuscular control/reeducation.  Therapeutic exercises prescribed per procedure note.  Ultimately this does seem to be most consistent thoracic outlet type symptoms that are likely correlating with some of the elbow pain he was having previously.  Osteopathic manipulation to the thoracic cage and upper extremity did relieve some of his symptoms and will have him continue with home therapeutic exercises.  [place] joint has arthroscopic changes and will benefit from improvement in mobility.  Follow-up: Return in about 2 weeks (around 04/18/2018) for consideration of repeat osteopathic manipulation and addition of scapular stabilization exercises to.       Please see additional documentation for Objective, Assessment and Plan sections. Pertinent additional documentation may be included in corresponding procedure notes, imaging studies, problem based documentation and patient instructions. Please see these sections of the encounter for additional information regarding this visit.  CMA/ATC served as Neurosurgeonscribe during this visit. History, Physical, and Plan performed by medical provider. Documentation and orders reviewed and attested to.      Andrew MewsMichael D Rigby, DO    [facility]

## 2018-04-04 NOTE — Progress Notes (Signed)
PROCEDURE NOTE: THERAPEUTIC EXERCISES (97110) 15 minutes spent for Therapeutic exercises as below and as referenced in the AVS.  This included exercises focusing on stretching, strengthening, with significant focus on eccentric aspects.   Proper technique shown and discussed handout in great detail with ATC.  All questions were discussed and answered.   Long term goals include an improvement in range of motion, strength, endurance as well as avoiding reinjury. Frequency of visits is one time as determined during today's  office visit. Frequency of exercises to be performed is as per handout.  EXERCISES REVIEWED: Andrew KayGoodman Exercises thoracic outlet stretching

## 2018-04-04 NOTE — Procedures (Signed)
LIMITED MSK ULTRASOUND OF Left [place] JOINT Images were obtained and interpreted by myself, Gaspar BiddingMichael Rosealie Reach, DO  Images have been saved and stored to PACS system. Images obtained on: GE S7 Ultrasound machine  FINDINGS:   Significant spurring of the [facility] on the left with a small amount of hypoechoic change directly over the [facility] joint directly correlating with the slight nodularity that he can appreciate.  There is no significant soft tissue masses or appreciable lymphadenopathy.  Normal contours of the clavicle.  IMPRESSION:  1. Sac joint arthropathy on the left

## 2018-04-14 ENCOUNTER — Encounter: Payer: Self-pay | Admitting: Sports Medicine

## 2018-04-18 ENCOUNTER — Ambulatory Visit: Payer: BLUE CROSS/BLUE SHIELD | Admitting: Sports Medicine

## 2018-06-18 ENCOUNTER — Other Ambulatory Visit: Payer: Self-pay

## 2018-06-18 ENCOUNTER — Telehealth: Payer: Self-pay | Admitting: Family Medicine

## 2018-06-18 MED ORDER — CEPHALEXIN 500 MG PO CAPS: 500.0000 mg | ORAL_CAPSULE | Freq: Two times a day (BID) | ORAL | 0 refills | Status: DC

## 2018-06-18 NOTE — Telephone Encounter (Signed)
Please advise 

## 2018-06-18 NOTE — Telephone Encounter (Signed)
Keflex 500 BID X 10 days.

## 2018-06-18 NOTE — Telephone Encounter (Signed)
Copied from CRM [id number]. Topic: Quick Communication - See Telephone Encounter >> Jun 18, 2018  2:15 PM Luanna Cole wrote: CRM for notification. See Telephone encounter for: 06/18/18. Pt called and stated that his three kids that have all had strep. Pediatrician has suggested that everyone in the house be on antibiotics to stop the further spread of strep. Patient and wife Ronell Duffus would need anabiotics. They are requesting Keflex. PT states that wife is allergic to penicillin. Please advise

## 2018-06-19 NOTE — Telephone Encounter (Signed)
Sent in yesterday.

## 2018-07-02 DIAGNOSIS — D229 Melanocytic nevi, unspecified: Secondary | ICD-10-CM | POA: Diagnosis not present

## 2018-07-02 DIAGNOSIS — L821 Other seborrheic keratosis: Secondary | ICD-10-CM | POA: Diagnosis not present

## 2018-07-02 DIAGNOSIS — L814 Other melanin hyperpigmentation: Secondary | ICD-10-CM | POA: Diagnosis not present

## 2018-07-02 DIAGNOSIS — L280 Lichen simplex chronicus: Secondary | ICD-10-CM | POA: Diagnosis not present

## 2018-07-02 DIAGNOSIS — D485 Neoplasm of uncertain behavior of skin: Secondary | ICD-10-CM | POA: Diagnosis not present

## 2018-07-02 DIAGNOSIS — D1801 Hemangioma of skin and subcutaneous tissue: Secondary | ICD-10-CM | POA: Diagnosis not present

## 2018-07-03 DIAGNOSIS — L821 Other seborrheic keratosis: Secondary | ICD-10-CM | POA: Diagnosis not present

## 2019-05-19 ENCOUNTER — Ambulatory Visit (INDEPENDENT_AMBULATORY_CARE_PROVIDER_SITE_OTHER): Payer: BC Managed Care – PPO | Admitting: Family Medicine

## 2019-05-19 DIAGNOSIS — R41840 Attention and concentration deficit: Secondary | ICD-10-CM

## 2019-05-19 DIAGNOSIS — F419 Anxiety disorder, unspecified: Secondary | ICD-10-CM | POA: Diagnosis not present

## 2019-05-19 NOTE — Progress Notes (Signed)
   Chief Complaint:  Andrew Wiley is a 38 y.o. male who presents today for a virtual office visit with a chief complaint of difficulty focusing.   Assessment/Plan:  Difficulty Focusing / Anxiety Patient likely has underlying generalized anxiety.  Possibly has underlying ADHD as well.  Briefly discussed mindfulness techniques.  He is not sure if he wants to start medications or not.  Will place referral to psychiatry for formal evaluation for ADHD.Hopefully he will also learn other coping techniques there as well.  Discussed reasons to return to care.      Subjective:  HPI:  Difficulty Focusing Started several years ago at least since school. Has difficulty focusing on tasks at work, especially now that he is working from home.   Has also had some increased anxiety recently. He has had this for several year but has worsened recently due to the [place] pandemic.  Feels like he has a more generalized anxiety. He is worried that bad things may happen. His worries have made it difficult for him to accomplish things that he wants to do or things that he needs to do.   ROS: Per HPI  PMH: He reports that he has never smoked. He has quit using smokeless tobacco. He reports current alcohol use of about 5.0 standard drinks of alcohol per week. He reports that he does not use drugs.      Objective/Observations  Physical Exam: Gen: NAD, resting comfortably Pulm: Normal work of breathing Neuro: Grossly normal, moves all extremities Psych: Normal affect and thought content  Virtual Visit via Video   I connected with Andrew Wiley on 05/19/19 at  3:40 PM EDT by a video enabled telemedicine application and verified that I am speaking with the correct person using two identifiers. I discussed the limitations of evaluation and management by telemedicine and the availability of in person appointments. The patient expressed understanding and agreed to proceed.   Patient location: Home Provider  location: [place] participating in the virtual visit: Myself and Patient     Andrew Wiley. [name], MD 05/19/2019 2:41 PM

## 2019-06-11 ENCOUNTER — Encounter: Payer: Self-pay | Admitting: Family Medicine

## 2019-06-11 MED ORDER — SERTRALINE HCL 50 MG PO TABS: 50.0000 mg | ORAL_TABLET | Freq: Every day | ORAL | 1 refills | Status: DC

## 2019-08-18 DIAGNOSIS — L819 Disorder of pigmentation, unspecified: Secondary | ICD-10-CM | POA: Diagnosis not present

## 2019-08-18 DIAGNOSIS — D1801 Hemangioma of skin and subcutaneous tissue: Secondary | ICD-10-CM | POA: Diagnosis not present

## 2019-08-18 DIAGNOSIS — L738 Other specified follicular disorders: Secondary | ICD-10-CM | POA: Diagnosis not present

## 2019-08-18 DIAGNOSIS — D229 Melanocytic nevi, unspecified: Secondary | ICD-10-CM | POA: Diagnosis not present

## 2019-09-08 ENCOUNTER — Other Ambulatory Visit: Payer: Self-pay | Admitting: Family Medicine

## 2019-10-15 IMAGING — DX DG CHEST 2V
2 series · 2 of 2 positions shown · non-contrast
Comparison: 08/16/2016

CLINICAL DATA: Left-sided chest pain for several years, no known
injury, initial encounter

EXAM:
CHEST - 2 VIEW

[chest pa]
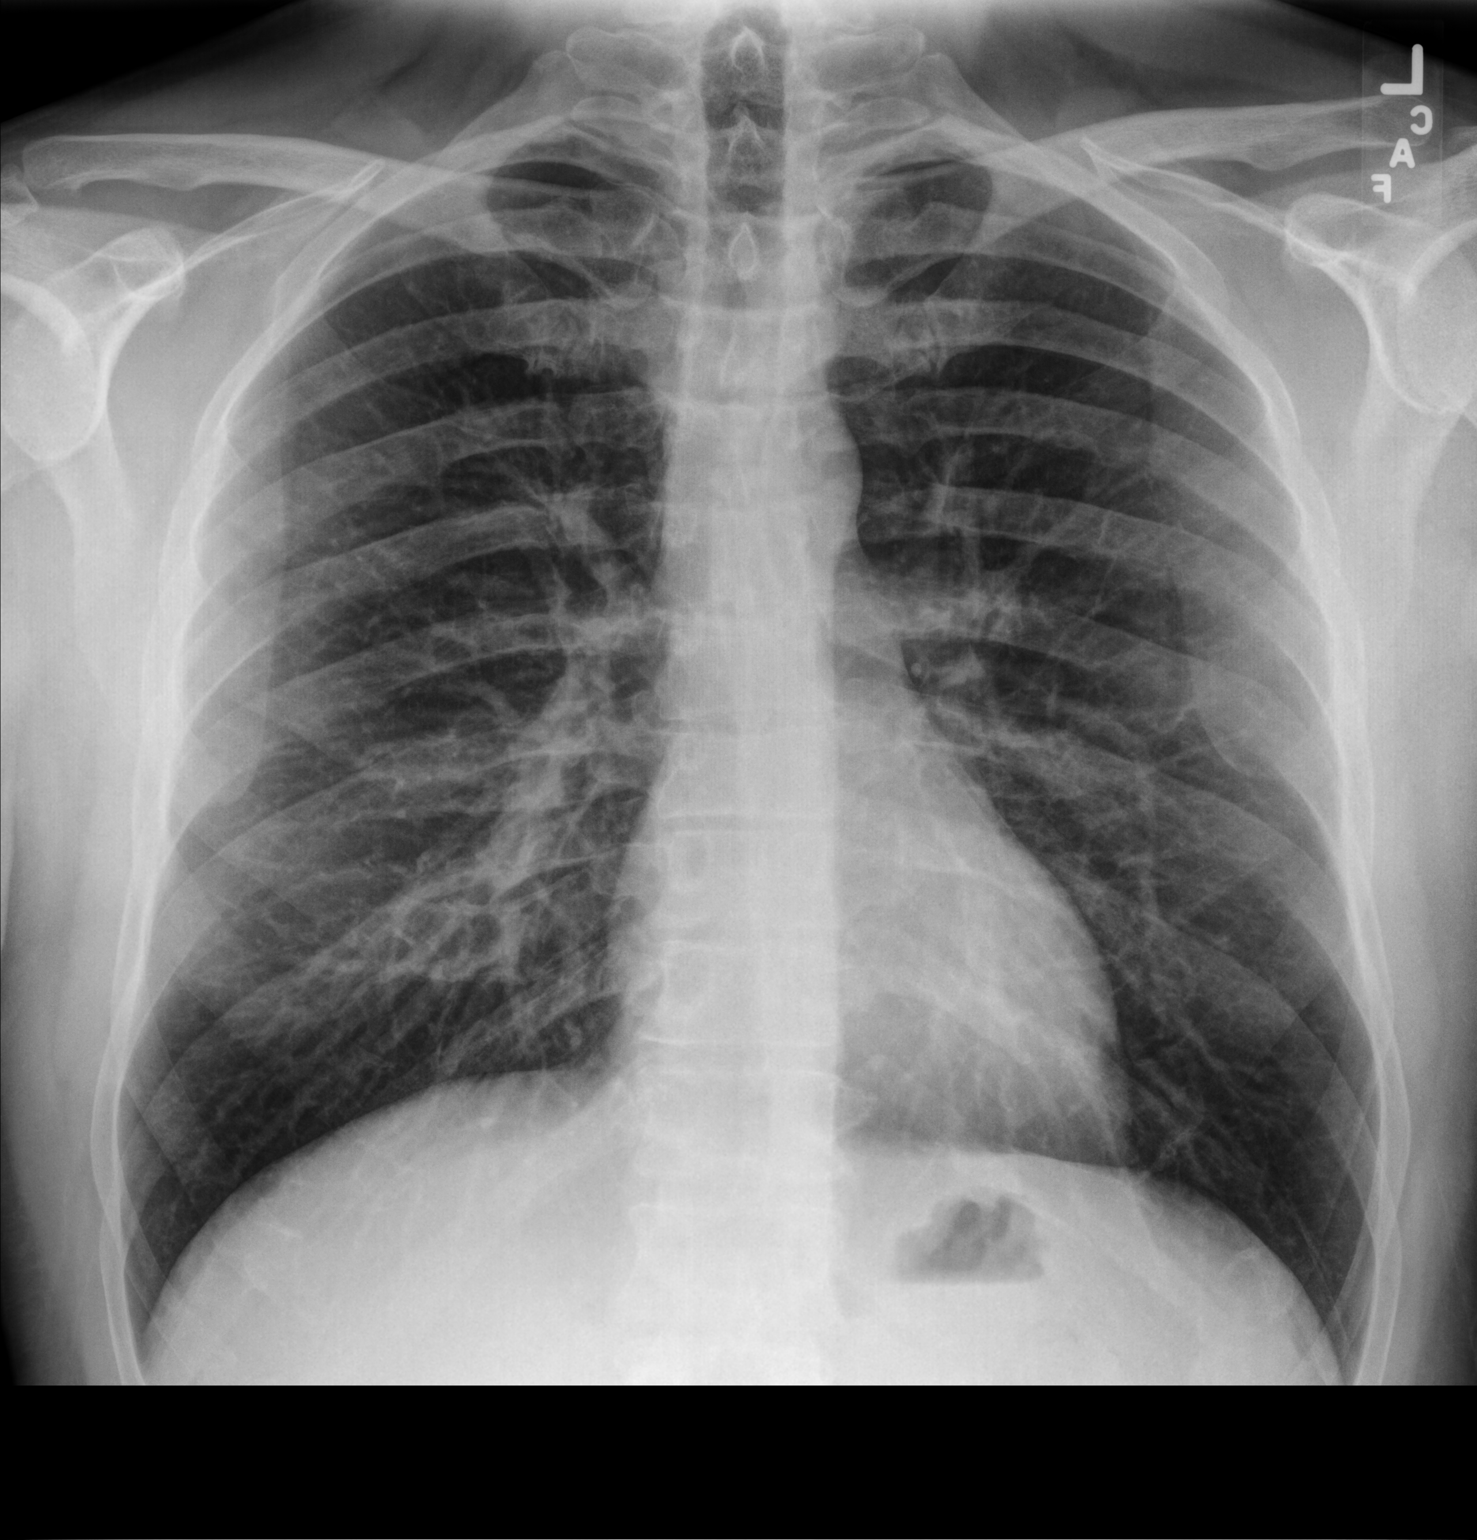

[chest lat]
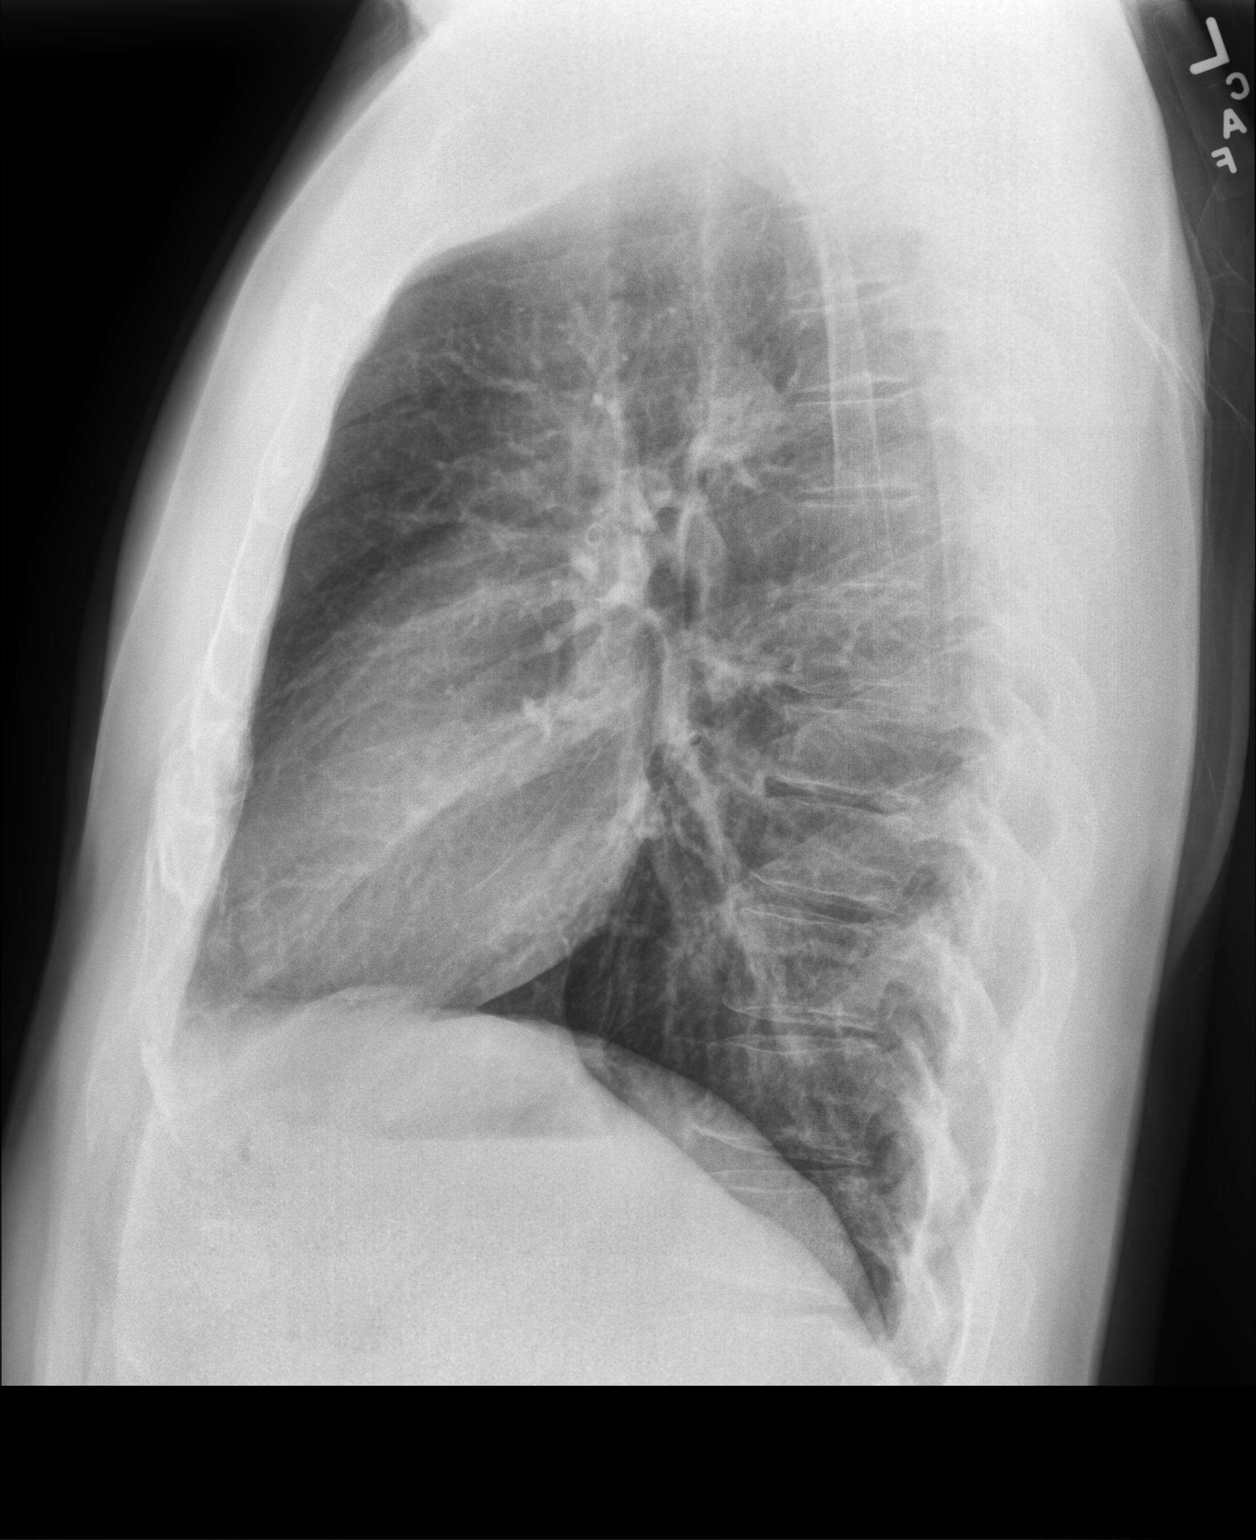

[2 of 2 positions shown; findings below may reference images not displayed]

FINDINGS: The heart size and mediastinal contours are within normal limits.
Both lungs are clear. The visualized skeletal structures are
unremarkable.
IMPRESSION: No active cardiopulmonary disease.

## 2019-12-28 DIAGNOSIS — R05 Cough: Secondary | ICD-10-CM | POA: Diagnosis not present

## 2019-12-28 DIAGNOSIS — R509 Fever, unspecified: Secondary | ICD-10-CM | POA: Diagnosis not present

## 2019-12-28 DIAGNOSIS — J029 Acute pharyngitis, unspecified: Secondary | ICD-10-CM | POA: Diagnosis not present

## 2019-12-28 DIAGNOSIS — Z1152 Encounter for screening for COVID-19: Secondary | ICD-10-CM | POA: Diagnosis not present

## 2020-06-21 ENCOUNTER — Encounter: Payer: Self-pay | Admitting: Physician Assistant

## 2020-06-28 ENCOUNTER — Encounter: Payer: Self-pay | Admitting: Family Medicine

## 2020-06-28 ENCOUNTER — Telehealth (INDEPENDENT_AMBULATORY_CARE_PROVIDER_SITE_OTHER): Payer: BC Managed Care – PPO | Admitting: Family Medicine

## 2020-06-28 DIAGNOSIS — J029 Acute pharyngitis, unspecified: Secondary | ICD-10-CM

## 2020-06-28 DIAGNOSIS — Z20818 Contact with and (suspected) exposure to other bacterial communicable diseases: Secondary | ICD-10-CM | POA: Diagnosis not present

## 2020-06-28 MED ORDER — AMOXICILLIN 500 MG PO CAPS: 500.0000 mg | ORAL_CAPSULE | Freq: Two times a day (BID) | ORAL | 0 refills | Status: AC

## 2020-06-28 NOTE — Progress Notes (Signed)
Virtual Visit via Video Note  I connected with Andrew Wiley  on 06/28/20 at  1:00 PM EDT by a video enabled telemedicine application and verified that I am speaking with the correct person using two identifiers.  Location patient: home, [place] Location provider:work or home office Persons participating in the virtual visit: patient, provider  I discussed the limitations of evaluation and management by telemedicine and the availability of in person appointments. The patient expressed understanding and agreed to proceed.    HPI:  Acute telemedicine visit for Sore throat: -Onset: 2 days ago -Symptoms include: sore throat, HA, stomach upset a little -all three kids with confirmed strep being treated right now -Denies: fevers, SOB, difficulty swallowing, cough -Has tried:nothing -Pertinent past medical history: no hx of tonsillectomy -Pertinent medication allergies: nkda -COVID-19 vaccine status: fully vaccinated, pfizer  ROS: See pertinent positives and negatives per HPI.  Past Medical History:  Diagnosis Date  . Allergy     Past Surgical History:  Procedure Laterality Date  . VASECTOMY    . WISDOM TOOTH EXTRACTION       Current Outpatient Medications:  .  CALCIUM CARBONATE PO, Take 1 tablet by mouth daily., Disp: , Rfl:  .  cetirizine (ZYRTEC) 10 MG tablet, Take 10 mg by mouth daily., Disp: , Rfl:  .  Multiple Vitamin (MULTIVITAMIN) tablet, Take 1 tablet by mouth daily., Disp: , Rfl:  .  ranitidine (ZANTAC) 150 MG tablet, Take 1 tablet (150 mg total) by mouth 2 (two) times daily., Disp: 60 tablet, Rfl: 1 .  sertraline (ZOLOFT) 50 MG tablet, Take 1 tablet (50 mg total) by mouth daily., Disp: 30 tablet, Rfl: 0 .  amoxicillin (AMOXIL) 500 MG capsule, Take 1 capsule (500 mg total) by mouth 2 (two) times daily., Disp: 20 capsule, Rfl: 0  EXAM:  VITALS per patient if applicable:  GENERAL: alert, oriented, appears well and in no acute distress  HEENT: atraumatic, conjunttiva clear, no  obvious abnormalities on inspection of external nose and ears  NECK: normal movements of the head and neck  LUNGS: on inspection no signs of respiratory distress, breathing rate appears normal, no obvious gross SOB, gasping or wheezing, attempted oropharyngeal exam - tongue in the way on this video visit  CV: no obvious cyanosis  MS: moves all visible extremities without noticeable abnormality  PSYCH/NEURO: pleasant and cooperative, no obvious depression or anxiety, speech and thought processing grossly intact  ASSESSMENT AND PLAN:  Discussed the following assessment and plan:  Sore throat  Strep throat exposure  -we discussed possible serious and likely etiologies, options for evaluation and workup, limitations of telemedicine visit vs in person visit, treatment, treatment risks and precautions. Pt prefers to treat via telemedicine empirically rather than in person at this moment. Likely strep pharyngitis given classic symptoms and close exposures. Discussed other potential etiologies as well. He opted for empiric treatment with amoxicillin 500 mg twice daily for 10 days. We also discussed options for COVID-19 testing and staying home while sick. Discussed potential complications and precautions. Work/School slipped offered:  declined Scheduled follow up with PCP offered: He agrees to follow-up if needed. Advised to seek prompt follow up telemedicine visit or in person care if worsening, new symptoms arise, or if is not improving with treatment. Did let this patient know that I only do telemedicine on Tuesdays and Thursdays for [place]. Advised to schedule follow up visit with PCP or [facility] if any further questions or concerns to avoid delays in care.   I discussed  the assessment and treatment plan with the patient. The patient was provided an opportunity to ask questions and all were answered. The patient agreed with the plan and demonstrated an understanding of the instructions.      Terressa Koyanagi, DO

## 2020-06-28 NOTE — Patient Instructions (Addendum)
-  I sent the medication(s) we discussed to your pharmacy: Meds ordered this encounter  Medications  . amoxicillin (AMOXIL) 500 MG capsule    Sig: Take 1 capsule (500 mg total) by mouth 2 (two) times daily.    Dispense:  20 capsule    Refill:  0     I hope you are feeling better soon! Seek care promptly if your symptoms worsen, new concerns arise or you are not improving with treatment.
